# Patient Record
Sex: Male | Born: 1960 | Race: White | Hispanic: No | Marital: Married | Smoking: Former smoker
Health system: Southern US, Community
[De-identification: ages and names within clinical notes are randomized; demographics above are authoritative.]

## PROBLEM LIST (undated history)

## (undated) DIAGNOSIS — F419 Anxiety disorder, unspecified: Secondary | ICD-10-CM

## (undated) DIAGNOSIS — K5792 Diverticulitis of intestine, part unspecified, without perforation or abscess without bleeding: Secondary | ICD-10-CM

## (undated) DIAGNOSIS — I10 Essential (primary) hypertension: Secondary | ICD-10-CM

## (undated) HISTORY — PX: COLON RESECTION: SHX5231

## (undated) HISTORY — PX: SKIN BIOPSY: SHX1

---

## 2009-04-03 ENCOUNTER — Emergency Department (HOSPITAL_COMMUNITY): Admission: EM | Admit: 2009-04-03 | Discharge: 2009-04-03 | Payer: Self-pay | Admitting: Emergency Medicine

## 2010-08-29 LAB — TROPONIN I: Troponin I: 0.01 ng/mL (ref 0.00–0.06)

## 2010-08-29 LAB — BASIC METABOLIC PANEL
GFR calc Af Amer: >60 mL/min (ref >60)
GFR calc non Af Amer: >60 mL/min (ref >60)
Potassium: 3.9 mEq/L (ref 3.5–5.1)
Sodium: 139 mEq/L (ref 135–145)

## 2011-09-18 ENCOUNTER — Encounter (HOSPITAL_BASED_OUTPATIENT_CLINIC_OR_DEPARTMENT_OTHER): Payer: Self-pay | Admitting: Emergency Medicine

## 2011-09-18 ENCOUNTER — Emergency Department (INDEPENDENT_AMBULATORY_CARE_PROVIDER_SITE_OTHER): Payer: 59

## 2011-09-18 ENCOUNTER — Observation Stay (HOSPITAL_BASED_OUTPATIENT_CLINIC_OR_DEPARTMENT_OTHER)
Admission: EM | Admit: 2011-09-18 | Discharge: 2011-09-19 | Disposition: A | Discharge status: Home / Self Care | Payer: 59 | Attending: Emergency Medicine | Admitting: Emergency Medicine

## 2011-09-18 DIAGNOSIS — I10 Essential (primary) hypertension: Secondary | ICD-10-CM | POA: Insufficient documentation

## 2011-09-18 DIAGNOSIS — R079 Chest pain, unspecified: Principal | ICD-10-CM | POA: Insufficient documentation

## 2011-09-18 DIAGNOSIS — F411 Generalized anxiety disorder: Secondary | ICD-10-CM | POA: Insufficient documentation

## 2011-09-18 HISTORY — DX: Anxiety disorder, unspecified: F41.9

## 2011-09-18 HISTORY — DX: Diverticulitis of intestine, part unspecified, without perforation or abscess without bleeding: K57.92

## 2011-09-18 HISTORY — DX: Essential (primary) hypertension: I10

## 2011-09-18 LAB — DIFFERENTIAL
Basophils Relative: 0 % (ref 0–1)
Lymphs Abs: 3 10*3/uL (ref 0.7–4.0)
Monocytes Relative: 7 % (ref 3–12)
Neutro Abs: 4.4 10*3/uL (ref 1.7–7.7)
Neutrophils Relative %: 51 % (ref 43–77)

## 2011-09-18 LAB — COMPREHENSIVE METABOLIC PANEL
ALT: 20 U/L (ref 0–53)
Albumin: 4.6 g/dL (ref 3.5–5.2)
Alkaline Phosphatase: 72 U/L (ref 39–117)
BUN: 19 mg/dL (ref 6–23)
Chloride: 101 mEq/L (ref 96–112)
Glucose, Bld: 102 mg/dL — ABNORMAL HIGH (ref 70–99)
Potassium: 4 mEq/L (ref 3.5–5.1)
Sodium: 140 mEq/L (ref 135–145)
Total Bilirubin: 0.5 mg/dL (ref 0.3–1.2)

## 2011-09-18 LAB — CARDIAC PANEL(CRET KIN+CKTOT+MB+TROPI)
CK, MB: 1.7 ng/mL (ref 0.3–4.0)
Relative Index: INVALID (ref 0.0–2.5)
Total CK: 88 U/L (ref 7–232)

## 2011-09-18 LAB — CBC
Hemoglobin: 16.1 g/dL (ref 13.0–17.0)
RBC: 5.02 MIL/uL (ref 4.22–5.81)

## 2011-09-18 LAB — TROPONIN I: Troponin I: <0.3 ng/mL (ref <0.30)

## 2011-09-18 MED ORDER — ASPIRIN 81 MG PO CHEW
324.0000 mg | CHEWABLE_TABLET | Freq: Once | ORAL | Status: AC
  Administered 2011-09-18: 324 mg via ORAL

## 2011-09-18 MED ORDER — ASPIRIN 81 MG PO CHEW: 324.0000 mg | CHEWABLE_TABLET | Freq: Once | ORAL | Status: DC

## 2011-09-18 MED ORDER — ASPIRIN 81 MG PO CHEW
CHEWABLE_TABLET | ORAL | Status: AC
  Filled 2011-09-18: qty 4

## 2011-09-18 MED ORDER — SODIUM CHLORIDE 0.9 % IV SOLN
1000.0000 mL | INTRAVENOUS | Status: DC
  Administered 2011-09-18 – 2011-09-19 (×3): 1000 mL via INTRAVENOUS

## 2011-09-18 NOTE — ED Provider Notes (Deleted)
7:31 PM  Date: 09/18/2011  Rate:78  Rhythm: normal sinus rhythm  QRS Axis: normal  Intervals: normal  ST/T Wave abnormalities: normal  Conduction Disutrbances:none  Narrative Interpretation: Normal EKG  Old EKG Reviewed: none available    Carleene Cooper III, MD 09/18/11 1932

## 2011-09-18 NOTE — ED Provider Notes (Signed)
History     CSN: [id number]  Arrival date & time 09/18/11  1911   First MD Initiated Contact with Patient 09/18/11 2028      Chief Complaint  Patient presents with  . Chest Pain    (Consider location/radiation/quality/duration/timing/severity/associated sxs/prior treatment) HPI  Patient history is for her to [facility] from Dr. Ignacia Palma to accepting ER provider, Dr. Judd Lien for chest pain protocol.  Pt is a 51 year old man who developed chest pain in the precordial region abut 3:30 P.M. Today. The pain radiated to his left arm. He finished work and went home and measured his blood pressure and that was elevataed at 144/94. He continued with pain, rated at a 4, and therefore called his primary care physician, Fuller Mandril, M.D., whose office advised ED evaluation. He had a prior chest pain workup 4 years ago in [place], [place], which showed a negative stress test and he was placed on Prilosec then. There is no history of known heart disease. He is on antihypertensive medication.  Chest pain occurs constantly. The chest pain is improving. At its most intense, the pain is at 4/10. The pain is currently at 1/10. The severity of the pain is moderate. The quality of the pain is described as pressure-like. The pain radiates to the left arm. Exacerbated by: Nothing. He tried nothing for the symptoms.   At time of transfer chest pain was rated at 0-1.  Negative chest x-ray, troponin, and non-provocative EKG prior to transfer. Patient voices his knowledge of the chest pain protocol and is agreeable to treatment plan. He is scheduled for CT coronary for the morning.   Past Medical History  Diagnosis Date  . Hypertension   . Diverticulitis   . Anxiety     Past Surgical History  Procedure Date  . Colon resection   . Skin biopsy     No family history on file.  History  Substance Use Topics  . Smoking status: Former Games developer  . Smokeless tobacco: Not on file    . Alcohol Use: Yes      Review of Systems  All other systems reviewed and are negative.    Allergies  Codeine  Home Medications   Current Outpatient Rx  Name Route Sig Dispense Refill  . BENAZEPRIL HCL 40 MG PO TABS Oral Take 20 mg by mouth daily. Patient cuts this medication in half, and takes 20 milligrams of this medication.    Marland Kitchen FISH OIL PO Oral Take 1 capsule by mouth daily.    Marland Kitchen PRILOSEC PO Oral Take 1 capsule by mouth daily. Patient uses this medication for heartburn.    Marland Kitchen PROBIOTIC FORMULA PO Oral Take 1 tablet by mouth daily. Patient uses this medication for digestive health.      BP 130/87  Pulse 72  Temp(Src) 98 F (36.7 C) (Oral)  Resp 18  Ht 5\' 10"  (1.778 m)  Wt 205 lb (92.987 kg)  BMI 29.41 kg/m2  SpO2 98%  Physical Exam  Nursing note and vitals reviewed. Constitutional: He is oriented to person, place, and time. He appears well-developed and well-nourished. No distress.  HENT:  Head: Normocephalic and atraumatic.  Eyes: Conjunctivae are normal.  Neck: Normal range of motion. Neck supple.  Cardiovascular: Normal rate, regular rhythm, normal heart sounds and intact distal pulses.  Exam reveals no gallop and no friction rub.   No murmur heard. Pulmonary/Chest: Effort normal and breath sounds normal. No respiratory distress. He has  no wheezes. He has no rales. He exhibits no tenderness.  Abdominal: Soft. Bowel sounds are normal. He exhibits no distension and no mass. There is no tenderness. There is no rebound and no guarding.  Musculoskeletal: Normal range of motion. He exhibits no edema and no tenderness.  Neurological: He is alert and oriented to person, place, and time.  Skin: Skin is warm and dry. No rash noted. He is not diaphoretic. No erythema.  Psychiatric: He has a normal mood and affect.    ED Course  Procedures (including critical care time)  Chest pain protocol initiated  Labs Reviewed  DIFFERENTIAL - Abnormal; Notable for the  following:    Eosinophils Relative 7 (*)    All other components within normal limits  COMPREHENSIVE METABOLIC PANEL - Abnormal; Notable for the following:    Glucose, Bld 102 (*)    All other components within normal limits  CBC  TROPONIN I  CK TOTAL AND CKMB  CARDIAC PANEL(CRET KIN+CKTOT+MB+TROPI)  CARDIAC PANEL(CRET KIN+CKTOT+MB+TROPI)  CARDIAC PANEL(CRET KIN+CKTOT+MB+TROPI)   Dg Chest 2 View  09/18/2011  *RADIOLOGY REPORT*  Clinical Data: Chest pain.  CHEST - 2 VIEW  Comparison: None.  Findings: Two views of the chest demonstrate clear lungs. Heart and mediastinum are within normal limits.  The trachea is midline. The T8 and T9 vertebral body heights are slightly decreased.  These findings are probably degenerative in etiology and difficult to exclude a mild compression deformity, particularly at T8.  IMPRESSION: No acute chest findings.  Mild vertebral body height in the lower thoracic spine as described.  Suspect this is degenerative in etiology but recommend clinical correlation.  Original Report Authenticated By: Richarda Overlie, M.D.     1. Chest pain       MDM   Patient has been chest pain-free since arrival to the CDU. Sign out given to Dr. Dierdre Highman. Patient expresses his high degree of claustrophobia and stress even with prior CT scans and therefore we'll switch his study to a cardiac stress test and echo in the morning because of potential complication to decrease heart rate below 60 which is needed for coronary CT. Patient is agreeable plan.       Jenness Corner, [place] 09/18/11 2355

## 2011-09-18 NOTE — ED Notes (Signed)
Pt reports tightness to left side of chest with pain in left arm and nausea. Onset of symptoms 1530 today while at work sitting at desk.

## 2011-09-18 NOTE — ED Notes (Signed)
MD at bedside. 

## 2011-09-18 NOTE — ED Provider Notes (Cosign Needed)
History     CSN: [id number]  Arrival date & time 09/18/11  1911   First MD Initiated Contact with Patient 09/18/11 2028      Chief Complaint  Patient presents with  . Chest Pain    (Consider location/radiation/quality/duration/timing/severity/associated sxs/prior treatment) HPI Comments: Pt is a 51 year old man who developed chest pain in the precordial region abut 3:30 P.M. Today.  The pain radiated to his left arm.  He finished work and went home and measured his blood pressure and that was elevataed at 144/94.  He continued with pain, rated at a 4, and therefore called his primary care physician, Fuller Mandril, M.D., whose office advised ED evaluation.  He had a prior chest pain workup 4 years ago in [place], [place], which showed a negative stress test and he was placed on Prilosec then.  There is no history of known heart disease.  He is on antihypertensive medication.  Patient is a 51 y.o. male presenting with chest pain.  Chest Pain The chest pain began 6 - 12 hours ago. Chest pain occurs constantly. The chest pain is improving. At its most intense, the pain is at 4/10. The pain is currently at 1/10. The severity of the pain is moderate. The quality of the pain is described as pressure-like. The pain radiates to the left arm. Exacerbated by: Nothing. He tried nothing for the symptoms.     Past Medical History  Diagnosis Date  . Hypertension   . Diverticulitis   . Anxiety     Past Surgical History  Procedure Date  . Colon resection   . Skin biopsy     No family history on file.  History  Substance Use Topics  . Smoking status: Former Games developer  . Smokeless tobacco: Not on file  . Alcohol Use: Yes      Review of Systems  Constitutional: Negative.   HENT: Negative.   Eyes: Negative.   Respiratory: Negative.   Cardiovascular: Positive for chest pain.  Gastrointestinal: Negative.   Genitourinary: Negative.   Musculoskeletal: Negative.   Skin: Negative.     Neurological: Negative.   Psychiatric/Behavioral: Negative.     Allergies  Codeine  Home Medications   Current Outpatient Rx  Name Route Sig Dispense Refill  . BENAZEPRIL HCL 40 MG PO TABS Oral Take 20 mg by mouth daily. Patient cuts this medication in half, and takes 20 milligrams of this medication.    Marland Kitchen FISH OIL PO Oral Take 1 capsule by mouth daily.    Marland Kitchen PRILOSEC PO Oral Take 1 capsule by mouth daily. Patient uses this medication for heartburn.    Marland Kitchen PROBIOTIC FORMULA PO Oral Take 1 tablet by mouth daily. Patient uses this medication for digestive health.      BP 160/99  Pulse 72  Temp(Src) 98 F (36.7 C) (Oral)  Resp 18  Ht 5\' 10"  (1.778 m)  Wt 205 lb (92.987 kg)  BMI 29.41 kg/m2  SpO2 99%  Physical Exam  Nursing note and vitals reviewed. Constitutional: He is oriented to person, place, and time. He appears well-developed and well-nourished.  HENT:  Head: Normocephalic and atraumatic.  Right Ear: External ear normal.  Left Ear: External ear normal.  Mouth/Throat: Oropharynx is clear and moist.  Eyes: Conjunctivae and EOM are normal. Pupils are equal, round, and reactive to light. Right eye exhibits discharge.  Neck: Normal range of motion. Neck supple.  Cardiovascular: Normal rate, regular rhythm and normal heart sounds.   Pulmonary/Chest: Effort normal  and breath sounds normal.  Abdominal: Soft. Bowel sounds are normal.  Musculoskeletal: Normal range of motion.  Neurological: He is alert and oriented to person, place, and time.       No sensory or motor deficit   Skin: Skin is warm and dry.  Psychiatric: He has a normal mood and affect. His behavior is normal.    ED Course  Procedures (including critical care time)  Labs Reviewed  DIFFERENTIAL - Abnormal; Notable for the following:    Eosinophils Relative 7 (*)    All other components within normal limits  COMPREHENSIVE METABOLIC PANEL - Abnormal; Notable for the following:    Glucose, Bld 102 (*)     All other components within normal limits  CBC  TROPONIN I  CK TOTAL AND CKMB   Dg Chest 2 View  09/18/2011  *RADIOLOGY REPORT*  Clinical Data: Chest pain.  CHEST - 2 VIEW  Comparison: None.  Findings: Two views of the chest demonstrate clear lungs. Heart and mediastinum are within normal limits.  The trachea is midline. The T8 and T9 vertebral body heights are slightly decreased.  These findings are probably degenerative in etiology and difficult to exclude a mild compression deformity, particularly at T8.  IMPRESSION: No acute chest findings.  Mild vertebral body height in the lower thoracic spine as described.  Suspect this is degenerative in etiology but recommend clinical correlation.  Original Report Authenticated By: Richarda Overlie, M.D.   7:31 PM  Date: 09/18/2011  Rate:78  Rhythm: normal sinus rhythm  QRS Axis: normal  Intervals: normal  ST/T Wave abnormalities: normal  Conduction Disutrbances:none  Narrative Interpretation: Normal EKG  Old EKG Reviewed: none available   8:53 PM Results for orders placed during the hospital encounter of 09/18/11  CBC      Component Value Range   WBC 8.7  4.0 - 10.5 (K/uL)   RBC 5.02  4.22 - 5.81 (MIL/uL)   Hemoglobin 16.1  13.0 - 17.0 (g/dL)   HCT 16.1  09.6 - 04.5 (%)   MCV 90.8  78.0 - 100.0 (fL)   MCH 32.1  26.0 - 34.0 (pg)   MCHC 35.3  30.0 - 36.0 (g/dL)   RDW 40.9  81.1 - 91.4 (%)   Platelets 190  150 - 400 (K/uL)  DIFFERENTIAL      Component Value Range   Neutrophils Relative 51  43 - 77 (%)   Neutro Abs 4.4  1.7 - 7.7 (K/uL)   Lymphocytes Relative 35  12 - 46 (%)   Lymphs Abs 3.0  0.7 - 4.0 (K/uL)   Monocytes Relative 7  3 - 12 (%)   Monocytes Absolute 0.6  0.1 - 1.0 (K/uL)   Eosinophils Relative 7 (*) 0 - 5 (%)   Eosinophils Absolute 0.6  0.0 - 0.7 (K/uL)   Basophils Relative 0  0 - 1 (%)   Basophils Absolute 0.0  0.0 - 0.1 (K/uL)  COMPREHENSIVE METABOLIC PANEL      Component Value Range   Sodium 140  135 - 145 (mEq/L)    Potassium 4.0  3.5 - 5.1 (mEq/L)   Chloride 101  96 - 112 (mEq/L)   CO2 28  19 - 32 (mEq/L)   Glucose, Bld 102 (*) 70 - 99 (mg/dL)   BUN 19  6 - 23 (mg/dL)   Creatinine, Ser 7.82  0.50 - 1.35 (mg/dL)   Calcium 9.7  8.4 - 95.6 (mg/dL)   Total Protein 7.7  6.0 - 8.3 (g/dL)  Albumin 4.6  3.5 - 5.2 (g/dL)   AST 21  0 - 37 (U/L)   ALT 20  0 - 53 (U/L)   Alkaline Phosphatase 72  39 - 117 (U/L)   Total Bilirubin 0.5  0.3 - 1.2 (mg/dL)   GFR calc non Af Amer >90  >90 (mL/min)   GFR calc Af Amer >90  >90 (mL/min)  TROPONIN I      Component Value Range   Troponin I <0.30  <0.30 (ng/mL)  CK TOTAL AND CKMB      Component Value Range   Total CK 96  7 - 232 (U/L)   CK, MB 1.7  0.3 - 4.0 (ng/mL)   Relative Index RELATIVE INDEX IS INVALID  0.0 - 2.5    Dg Chest 2 View  09/18/2011  *RADIOLOGY REPORT*  Clinical Data: Chest pain.  CHEST - 2 VIEW  Comparison: None.  Findings: Two views of the chest demonstrate clear lungs. Heart and mediastinum are within normal limits.  The trachea is midline. The T8 and T9 vertebral body heights are slightly decreased.  These findings are probably degenerative in etiology and difficult to exclude a mild compression deformity, particularly at T8.  IMPRESSION: No acute chest findings.  Mild vertebral body height in the lower thoracic spine as described.  Suspect this is degenerative in etiology but recommend clinical correlation.  Original Report Authenticated By: Richarda Overlie, M.D.    Pt seen and physical exam performed.  Lab workup with EKG, chest x-ray and cardiac enzymes was negative.  Call to Geoffery Lyons, M.D. At [facility] ED, who accepts pt in transfer to the CDU Observation unit for chest pain workup.    1. Chest pain          Carleene Cooper III, MD 09/18/11 2106

## 2011-09-19 ENCOUNTER — Encounter (HOSPITAL_COMMUNITY): Payer: Self-pay | Admitting: *Deleted

## 2011-09-19 ENCOUNTER — Other Ambulatory Visit (HOSPITAL_COMMUNITY): Payer: 59

## 2011-09-19 DIAGNOSIS — R072 Precordial pain: Secondary | ICD-10-CM

## 2011-09-19 LAB — CARDIAC PANEL(CRET KIN+CKTOT+MB+TROPI)
Relative Index: INVALID (ref 0.0–2.5)
Total CK: 73 U/L (ref 7–232)

## 2011-09-19 MED ORDER — PERFLUTREN LIPID MICROSPHERE 6.52 MG/ML IV SUSP
10.0000 uL/kg | Freq: Once | INTRAVENOUS | Status: AC
  Administered 2011-09-19: 10:00:00 via INTRAVENOUS

## 2011-09-19 NOTE — ED Notes (Signed)
Patient remains on monitor and sats of 99% with NAD at this time.

## 2011-09-19 NOTE — Discharge Instructions (Signed)
Read instructions below for reasons to return to the Emergency Department. It is recommended that your follow up with your Primary Care Doctor in regards to today's visit. If you do not have a doctor, use the resource guide listed below to help you find one. Begin taking over the counter Prilosec or Zegrid as directed.   Chest Pain (Nonspecific)  HOME CARE INSTRUCTIONS  For the next few days, avoid physical activities that bring on chest pain. Continue physical activities as directed.  Do not smoke cigarettes or drink alcohol until your symptoms are gone.  Only take over-the-counter or prescription medicine for pain, discomfort, or fever as directed by your caregiver.  Follow your caregiver's suggestions for further testing if your chest pain does not go away.  Keep any follow-up appointments you made. If you do not go to an appointment, you could develop lasting (chronic) problems with pain. If there is any problem keeping an appointment, you must call to reschedule.  SEEK MEDICAL CARE IF:  You think you are having problems from the medicine you are taking. Read your medicine instructions carefully.  Your chest pain does not go away, even after treatment.  You develop a rash with blisters on your chest.  SEEK IMMEDIATE MEDICAL CARE IF:  You have increased chest pain or pain that spreads to your arm, neck, jaw, back, or belly (abdomen).  You develop shortness of breath, an increasing cough, or you are coughing up blood.  You have severe back or abdominal pain, feel sick to your stomach (nauseous) or throw up (vomit).  You develop severe weakness, fainting, or chills.  You have an oral temperature above 102 F (38.9 C), not controlled by medicine.   THIS IS AN EMERGENCY. Do not wait to see if the pain will go away. Get medical help at once. Call your local emergency services (911 in [place] Do not drive yourself to the hospital.   RESOURCE GUIDE  Dental Problems  Patients with  Medicaid: [facility]                     [facility] [address]                                           [address] Phone:  [phone]                                                  Phone:  [phone]  If unable to pay or uninsured, contact:  [facility] or [facility] to become qualified for the adult dental clinic.  Chronic Pain Problems Contact [facility]  [phone] Patients need to be referred by their primary care doctor.  Insufficient Money for Medicine Contact [employer]:  call "211" or [facility] 271-5999.  No Primary Care Doctor Call [facility]  832-8000 Other agencies that provide inexpensive medical care    [facility]  [phone]     Internal Medicine  [phone]    [facility]  [phone]    Women's Clinic  [phone]    [facility]  [phone]    [facility]  [phone]  Psychological Services   [facility]  [phone] [facility]  [phone] [facility]   800 853-5163 (emergency services 641-4993)  Substance Abuse Resources [facility]  336-882-2125 [facility] 336-784-9470 [facility] 336-285-9073 [facility] 336-845-3988 Residential & Outpatient Substance Abuse Program  800-659-3381  Abuse/Neglect [place] Child Abuse Hotline (336) 641-3795 [place] Child Abuse Hotline 800-378-5315 (After Hours)  Emergency Shelter [facility] (336) 271-5985  [facility] (336) 275-9566 [facility] (704) 372-4663  MRSA Hotline #:   832-7006    [place] Resources  [facility] [place]     [employer]                          [facility] [address] [place]                       [address]      [address]  [place]                                                 [place]                            [place] Phone:  [phone]                                   Phone:  [phone]                 Phone:  [phone]  [facility] Phone:  [phone]  [place] Child Abuse Hotline (336) 342-1394 (336) 342-3537 (After Hours)   

## 2011-09-19 NOTE — ED Provider Notes (Signed)
7:38 AM Patient moved to CDU under chest pain protocol. Patient resting comfortably at present without return of chest pain over night. Lungs CTA bilaterally. S1/S2, RRR, no murmur. Abdomen soft, bowel sounds present. Strong distal pulses palpated all extremities. Sinus rhythm on monitor without ectopy. Troponin negative x 2. CK, CK-MB, 12 lead reviewed, no indication of ischemia. Patient scheduled for Stress echo in AM. Diagnostic and treatment plan discussed with patient.  9:00 AM Pt re-evaluated and is still CP free, VSS, and in NAD. No complaints at this time  10:15 AM Stress test pending still. Pt resting comfortably.  11:51 AM  Stress results: There are no ST or T wave changes to suggest ischemia. Maximal heart rate during stress was 169bpm. The target heart rate was achieved. The heart rate response to stress was normal. The rate-pressure product for the peak heart rate and blood pressure was Hg/min.  Patient is to be discharged with recommendation to follow up with PCP in regards to today's hospital visit. Chest pain is not likely of cardiac or pulmonary etiology d/t presentation, perc negative, VSS, no tracheal deviation, no JVD or new murmur, RRR, breath sounds equal bilaterally, EKG without acute abnormalities, negative troponin x 3, normal stress echo and negative CXR. Pt has been advised start a PPI and return to the ED is CP becomes exertional, associated with diaphoresis or nausea, radiates to left jaw/arm, worsens or becomes concerning in any way. Pt appears reliable for follow up and is agreeable to discharge.       Jaci Carrel, [place] 09/19/11 1153

## 2011-09-19 NOTE — ED Provider Notes (Signed)
Medical screening examination/treatment/procedure(s) were performed by non-physician practitioner and as supervising physician I was immediately available for consultation/collaboration.  Raeford Razor, MD 09/19/11 1655

## 2011-09-19 NOTE — ED Notes (Signed)
Diet tray ordered 

## 2011-09-19 NOTE — ED Notes (Signed)
Discharged home with written and verbal instruction.  Wife called to pick up patient.  Left ER; ambulatory steady gait.  No questions or concerns at discharge.

## 2011-09-19 NOTE — ED Notes (Signed)
Patient transported to Echo.

## 2011-09-19 NOTE — ED Notes (Signed)
First meeting with patient. Patient remains on monitor and sats of 98%. Patient denies any chest pain at this time.  

## 2011-09-19 NOTE — Progress Notes (Signed)
*  PRELIMINARY RESULTS* Echocardiogram Echocardiogram Stress Test with Definity has been performed.  Glean Salen [facility] 09/19/2011, 4:37 PM

## 2011-09-19 NOTE — OR Nursing (Signed)
1124 patient given 2cc IV definity for a stress test study.  Denies chest of back pain. On treadmill for a target heart rate of 150. 1055 3cc of definity IV given prior to leaving the treadmill 1057 2 cc of definity IV given  Denies chest pain or back pain

## 2011-09-22 NOTE — ED Provider Notes (Signed)
Medical screening examination/treatment/procedure(s) were performed by non-physician practitioner and as supervising physician I was immediately available for consultation/collaboration.  Josilynn Losh, MD 09/22/11 1721 

## 2011-09-22 NOTE — H&P (Signed)
Care assumed at shift change.  Patient in the CDU on the chest pain protocol.  Will undergo stress echo due to his claustrophobia with ct scans and mris.  Vitals are stable and patient is resting comfortable.

## 2011-09-23 MED FILL — Perflutren Lipid Microsphere IV Susp 6.52 MG/ML: INTRAVENOUS | Qty: 2 | Status: AC

## 2012-10-25 ENCOUNTER — Encounter (HOSPITAL_BASED_OUTPATIENT_CLINIC_OR_DEPARTMENT_OTHER): Payer: Self-pay | Admitting: *Deleted

## 2012-10-25 ENCOUNTER — Emergency Department (HOSPITAL_BASED_OUTPATIENT_CLINIC_OR_DEPARTMENT_OTHER)
Admission: EM | Admit: 2012-10-25 | Discharge: 2012-10-25 | Disposition: A | Discharge status: Home / Self Care | Payer: 59 | Attending: Emergency Medicine | Admitting: Emergency Medicine

## 2012-10-25 DIAGNOSIS — M549 Dorsalgia, unspecified: Secondary | ICD-10-CM

## 2012-10-25 DIAGNOSIS — Z8719 Personal history of other diseases of the digestive system: Secondary | ICD-10-CM | POA: Insufficient documentation

## 2012-10-25 DIAGNOSIS — M5380 Other specified dorsopathies, site unspecified: Secondary | ICD-10-CM | POA: Insufficient documentation

## 2012-10-25 DIAGNOSIS — G8929 Other chronic pain: Secondary | ICD-10-CM | POA: Insufficient documentation

## 2012-10-25 DIAGNOSIS — M545 Low back pain, unspecified: Secondary | ICD-10-CM | POA: Insufficient documentation

## 2012-10-25 DIAGNOSIS — I10 Essential (primary) hypertension: Secondary | ICD-10-CM | POA: Insufficient documentation

## 2012-10-25 DIAGNOSIS — R110 Nausea: Secondary | ICD-10-CM | POA: Insufficient documentation

## 2012-10-25 DIAGNOSIS — Z79899 Other long term (current) drug therapy: Secondary | ICD-10-CM | POA: Insufficient documentation

## 2012-10-25 DIAGNOSIS — Z87891 Personal history of nicotine dependence: Secondary | ICD-10-CM | POA: Insufficient documentation

## 2012-10-25 DIAGNOSIS — F411 Generalized anxiety disorder: Secondary | ICD-10-CM | POA: Insufficient documentation

## 2012-10-25 DIAGNOSIS — R209 Unspecified disturbances of skin sensation: Secondary | ICD-10-CM | POA: Insufficient documentation

## 2012-10-25 MED ORDER — SODIUM CHLORIDE 0.9 % IV BOLUS (SEPSIS)
500.0000 mL | Freq: Once | INTRAVENOUS | Status: AC
  Administered 2012-10-25: 1000 mL via INTRAVENOUS

## 2012-10-25 MED ORDER — HYDROMORPHONE HCL 2 MG/ML IJ SOLN: 1.0000 mg | Freq: Once | INTRAMUSCULAR | Status: DC

## 2012-10-25 MED ORDER — HYDROMORPHONE HCL PF 1 MG/ML IJ SOLN
INTRAMUSCULAR | Status: AC
  Administered 2012-10-25: 1 mg
  Filled 2012-10-25: qty 1

## 2012-10-25 MED ORDER — HYDROMORPHONE HCL PF 1 MG/ML IJ SOLN
INTRAMUSCULAR | Status: AC
  Administered 2012-10-25: 1 mg via INTRAVENOUS
  Filled 2012-10-25: qty 1

## 2012-10-25 MED ORDER — OXYCODONE-ACETAMINOPHEN 5-325 MG PO TABS: 1.0000 | ORAL_TABLET | Freq: Four times a day (QID) | ORAL | Status: DC | PRN

## 2012-10-25 MED ORDER — HYDROMORPHONE HCL 2 MG/ML IJ SOLN: 1.0000 mg | Freq: Once | INTRAMUSCULAR | Status: AC

## 2012-10-25 MED ORDER — SODIUM CHLORIDE 0.9 % IV SOLN: INTRAVENOUS | Status: DC

## 2012-10-25 MED ORDER — CYCLOBENZAPRINE HCL 10 MG PO TABS: 10.0000 mg | ORAL_TABLET | Freq: Two times a day (BID) | ORAL | Status: AC | PRN

## 2012-10-25 MED ORDER — ONDANSETRON HCL 4 MG/2ML IJ SOLN
4.0000 mg | Freq: Once | INTRAMUSCULAR | Status: AC
  Administered 2012-10-25: 4 mg via INTRAVENOUS
  Filled 2012-10-25: qty 2

## 2012-10-25 NOTE — ED Provider Notes (Signed)
History    This chart was scribed for Shelda Jakes, MD by Quintella Reichert, ED scribe.  This patient was seen in room MH04/MH04 and the patient's care was started at 4:07 PM.   CSN: [id number]  Arrival date & time 10/25/12  1546       Chief Complaint  Patient presents with  . Back Pain     Patient is a 52 y.o. male presenting with back pain. The history is provided by the patient. No language interpreter was used.  Back Pain Location:  Lumbar spine Radiates to: left side, left lower leg. Pain severity:  Moderate Onset quality:  Gradual Duration: Over 1 year. Timing:  Intermittent Progression:  Worsening Chronicity:  Chronic Context: not falling and not recent injury   Relieved by:  Nothing Worsened by:  Nothing tried Ineffective treatments: oxycodone. Associated symptoms: numbness   Associated symptoms: no abdominal pain, no bladder incontinence, no chest pain, no dysuria, no headaches, no leg pain and no weakness     HPI Comments: Ronald Long is a 52 y.o. male who presents to the Emergency Department complaining of progressively-worsening, moderate-to-severe left lower back back pain that began over 1 year ago but has become more severe over the past 6-8 weeks.  Pt notes that he has been experiencing intermittent pain in the area for over 1 year accompanied by radiation down the lower left leg.  He notes that he went to [facility] 1 year ago and was given an MRI, which revealed a pinched sciatic nerve.  He was advised that surgery would likely not help.  He reports that 6-8 weeks ago pain became more severe and was accompanied by spasms in the area.  He denies recent falls or injuries that may have exacerbated pain.  He states pain is aggravated by turning and normally relieved by standing up, though he notes that today nothing relieves the pain.  He also notes that pain has been radiating to his left side today for the first time.  Pt states he attempted to  relieve symptoms today with oxycodone, without relief.  He also notes that on one occasion today his left leg and foot went numb on standing up, but presently he denies pain, numbness, or weakness in left calf or foot.  He also reports mild nausea associated with pain.  He denies fever, chills, cough, congestion, rhinorrhea, visual changes, CP, SOB, abdominal pain, emesis, diarrhea, dysuria, swelling in ankles, neck pain, rash, headache, or any other associated symptoms.  He denies h/o bleeding easily.  Pt medicates daily with benazepril-hcz, Zoloft and Prilosec.        Past Medical History  Diagnosis Date  . Hypertension   . Diverticulitis   . Anxiety     Past Surgical History  Procedure Laterality Date  . Colon resection    . Skin biopsy      No family history on file.  History  Substance Use Topics  . Smoking status: Former Games developer  . Smokeless tobacco: Not on file  . Alcohol Use: Yes      Review of Systems  HENT: Negative for congestion, rhinorrhea and neck pain.   Eyes: Negative for visual disturbance.  Respiratory: Negative for cough and shortness of breath.   Cardiovascular: Negative for chest pain and leg swelling.  Gastrointestinal: Positive for nausea. Negative for vomiting, abdominal pain and diarrhea.  Genitourinary: Negative for bladder incontinence and dysuria.  Musculoskeletal: Positive for back pain.  Skin: Negative for rash.  Neurological: Positive  for numbness. Negative for weakness and headaches.  Psychiatric/Behavioral: Negative for confusion.    Allergies  Codeine  Home Medications   Current Outpatient Rx  Name  Route  Sig  Dispense  Refill  . benazepril-hydrochlorthiazide (LOTENSIN HCT) 10-12.5 MG per tablet   Oral   Take 1 tablet by mouth daily.         . sertraline (ZOLOFT) 100 MG tablet   Oral   Take 100 mg by mouth daily.         . benazepril (LOTENSIN) 40 MG tablet   Oral   Take 20 mg by mouth daily. Patient cuts this medication  in half, and takes 20 milligrams of this medication.         . cyclobenzaprine (FLEXERIL) 10 MG tablet   Oral   Take 1 tablet (10 mg total) by mouth 2 (two) times daily as needed for muscle spasms.   20 tablet   0   . Omega-3 Fatty Acids (FISH OIL PO)   Oral   Take 1 capsule by mouth daily.         . Omeprazole (PRILOSEC PO)   Oral   Take 1 capsule by mouth daily. Patient uses this medication for heartburn.         Marland Kitchen oxyCODONE-acetaminophen (PERCOCET/ROXICET) 5-325 MG per tablet   Oral   Take 1-2 tablets by mouth every 6 (six) hours as needed for pain.   20 tablet   0   . Probiotic Product (PROBIOTIC FORMULA PO)   Oral   Take 1 tablet by mouth daily. Patient uses this medication for digestive health.           BP 121/90  Pulse 78  Temp(Src) 98 F (36.7 C)  Resp 20  SpO2 99%  Physical Exam  Nursing note and vitals reviewed. Constitutional: He is oriented to person, place, and time.  Eyes: Conjunctivae and EOM are normal. Pupils are equal, round, and reactive to light.  Cardiovascular: Normal rate and regular rhythm.   No murmur heard. Pulmonary/Chest: Effort normal and breath sounds normal. No respiratory distress. He has no wheezes. He has no rales.  Lungs clear bilaterally  Abdominal: Soft. Bowel sounds are normal. There is no tenderness.  Musculoskeletal: Normal range of motion.  Back non-tender. No muscle spasms in lumbar region.  Neurological: He is alert and oriented to person, place, and time. No cranial nerve deficit.    ED Course  Procedures (including critical care time)  DIAGNOSTIC STUDIES: Oxygen Saturation is 99% on room air, normal by my interpretation.    COORDINATION OF CARE: 4:11 PM-Discussed treatment plan which includes dilaudid in ED and home treatment with flexeril and oxycodone with pt at bedside and pt agreed to plan.      Labs Reviewed - No data to display No results found.   1. Back pain       MDM  Patient with  history of low back pain worse in the last few days. No neuro focal deficits consistent with sciatica at this point in time. No new injury to the back. Patient improved with pain medication in the emergency department. Patient has followup with orthopedics as needed. Patient we discharged home with her renewal of his Percocet and had Flexeril.       I personally performed the services described in this documentation, which was scribed in my presence. The recorded information has been reviewed and is accurate.     Shelda Jakes, MD 10/25/12 [phone]

## 2012-10-25 NOTE — ED Notes (Signed)
Patient has been experiencing back spasms for the past few weeks on the left and today it started hurting and patient unable to bend over. Took molixcam at 12pm and oxycodone 1:30pm, but no relief

## 2013-05-26 IMAGING — CR DG CHEST 2V
2 series · 2 of 2 positions shown · non-contrast
Comparison: None.

CLINICAL DATA: Chest pain.

CHEST - 2 VIEW

[w chest pa]
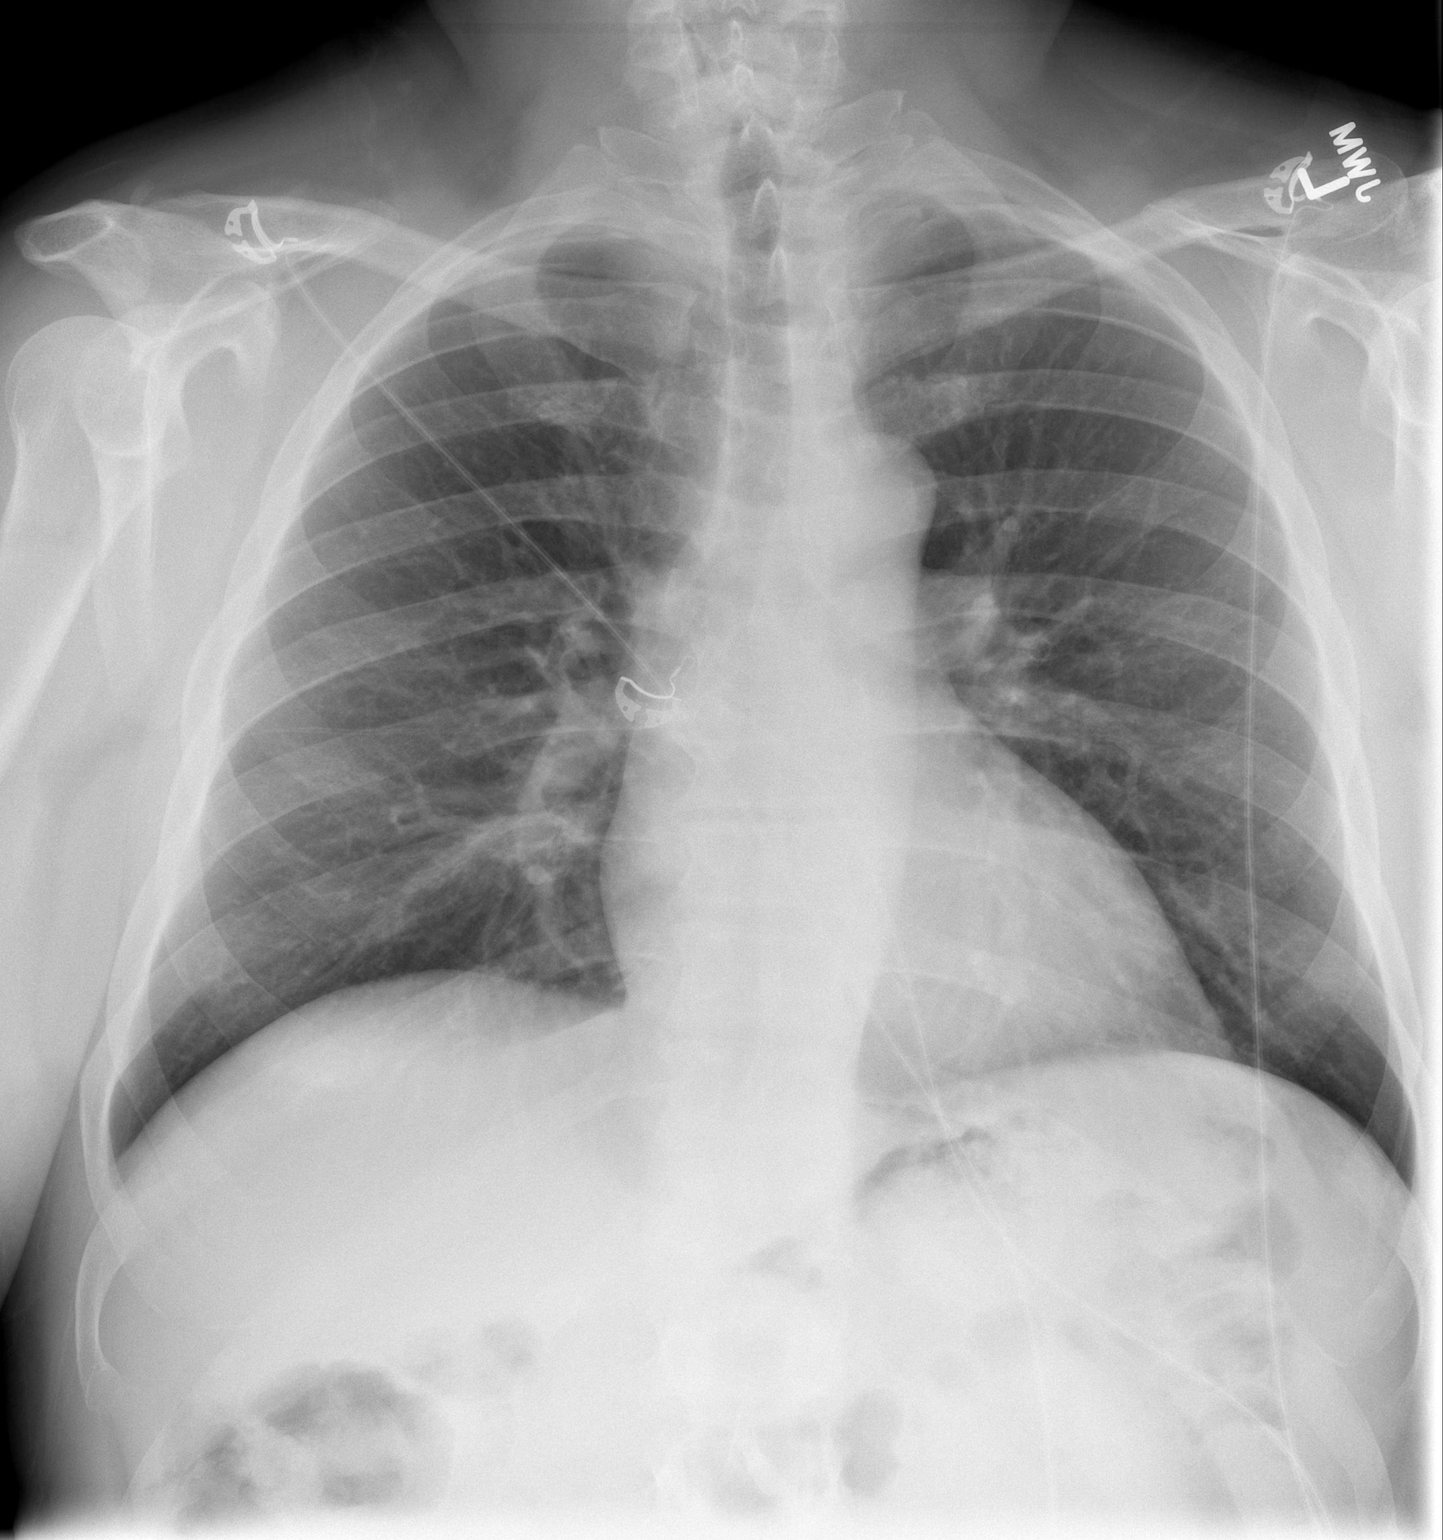

[w chest lat]
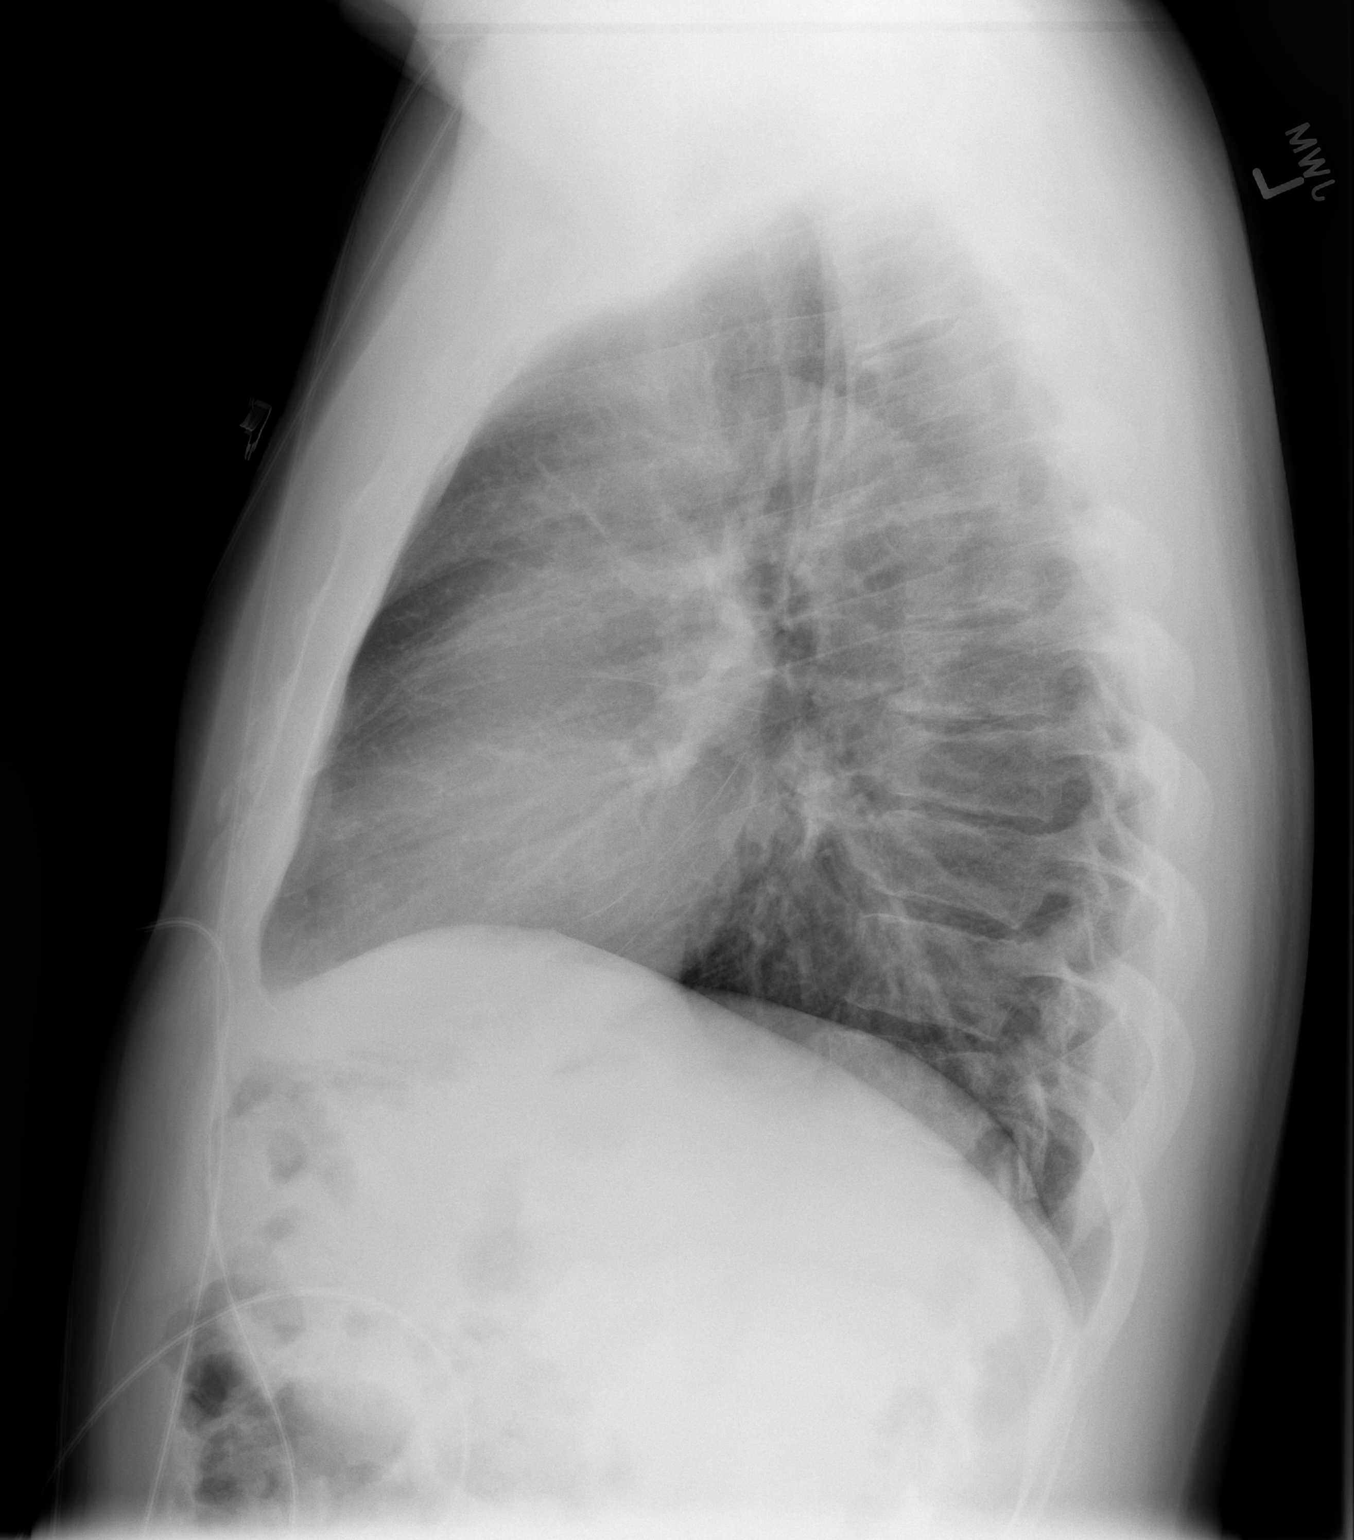

[2 of 2 positions shown; findings below may reference images not displayed]

FINDINGS: Two views of the chest demonstrate clear lungs. Heart and
mediastinum are within normal limits.  The trachea is midline. The
T8 and T9 vertebral body heights are slightly decreased.  These
findings are probably degenerative in etiology and difficult to
exclude a mild compression deformity, particularly at T8.
IMPRESSION: No acute chest findings.

Mild vertebral body height in the lower thoracic spine as
described.  Suspect this is degenerative in etiology but recommend
clinical correlation.

## 2016-08-08 ENCOUNTER — Encounter: Payer: Self-pay | Admitting: Pulmonary Disease

## 2016-08-08 ENCOUNTER — Ambulatory Visit (INDEPENDENT_AMBULATORY_CARE_PROVIDER_SITE_OTHER): Payer: 59 | Admitting: Pulmonary Disease

## 2016-08-08 VITALS — BP 130/76 | HR 90 | Ht 69.5 in | Wt 246.0 lb

## 2016-08-08 DIAGNOSIS — R059 Cough, unspecified: Secondary | ICD-10-CM

## 2016-08-08 DIAGNOSIS — R05 Cough: Secondary | ICD-10-CM

## 2016-08-08 DIAGNOSIS — G4719 Other hypersomnia: Secondary | ICD-10-CM | POA: Diagnosis not present

## 2016-08-08 LAB — NITRIC OXIDE: Nitric Oxide: 45

## 2016-08-08 MED ORDER — AZELASTINE-FLUTICASONE 137-50 MCG/ACT NA SUSP: 2.0000 | Freq: Every day | NASAL | 3 refills | Status: AC

## 2016-08-08 MED ORDER — OMEPRAZOLE 40 MG PO CPDR: 40.0000 mg | DELAYED_RELEASE_CAPSULE | Freq: Every day | ORAL | 5 refills | Status: DC

## 2016-08-08 NOTE — Progress Notes (Signed)
Ronald Long    [id number]    Jan 11, 1961  Primary Care Physician:[facility]  Referring Physician: No referring provider defined for this encounter.  Chief complaint:  Consult for evaluation of chronic cough  HPI: Ronald Long is 56 year old with past medical history of hypertension, barrets esophagitis. He has complaints of chronic cough since September 2016. The symptoms are nonproductive cough, he denies any wheezing, chest pain, palpitations. He has dyspnea during his coughing spells. He has episodes of paroxysmal coughing with associated dizziness. He's had multiple interventions including allergy test, prednisone, Flonase, cough syrup with minimal improvement in symptoms. He's used albuterol with some relief.   He had been maintained on Benzapril and hydrochlorothiazide for his hypertension for the past 4 years and has not had any issues with this before. He denies any seasonal allergies, postnasal drip. He has a history of Barrett's esophagitis diagnosed with EGD in the past and is on Prilosec 20 mg daily. He has not reported any sensitivities to cats or dogs. He has 3 cats at home, minor mold issues. He 10 pack smoking history quit in 2006. 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 His job involves a lot of talking which exacerbates his cough. He has symptoms of snoring, witnessed apneas and daytime sleepiness. He has never been evaluated for sleep apnea.  Outpatient Encounter Prescriptions as of 08/08/2016  Medication Sig  . ALPRAZolam (XANAX) 0.5 MG tablet TK 1 T PO TID PRN FOR ANXIETY  . benazepril (LOTENSIN) 40 MG tablet Take 20 mg by mouth daily. Patient cuts this medication in half, and takes 20 milligrams of this medication.  . cyclobenzaprine (FLEXERIL) 10 MG tablet Take 1 tablet (10 mg total) by mouth 2 (two) times daily as needed for muscle spasms.  Marland Kitchen ibuprofen (ADVIL,MOTRIN) 200 MG tablet Take 200 mg by mouth every  6 (six) hours as needed.  . Omeprazole (PRILOSEC PO) Take 1 capsule by mouth daily. Patient uses this medication for heartburn.  Marland Kitchen oxyCODONE-acetaminophen (PERCOCET/ROXICET) 5-325 MG per tablet Take 1-2 tablets by mouth every 6 (six) hours as needed for pain.  Marland Kitchen sertraline (ZOLOFT) 100 MG tablet Take 100 mg by mouth daily.  . [DISCONTINUED] benazepril-hydrochlorthiazide (LOTENSIN HCT) 10-12.5 MG per tablet Take 1 tablet by mouth daily.  . [DISCONTINUED] Omega-3 Fatty Acids (FISH OIL PO) Take 1 capsule by mouth daily.  . [DISCONTINUED] Probiotic Product (PROBIOTIC FORMULA PO) Take 1 tablet by mouth daily. Patient uses this medication for digestive health.   No facility-administered encounter medications on file as of 08/08/2016.     Allergies as of 08/08/2016 - Review Complete 08/08/2016  Allergen Reaction Noted  . Codeine Nausea And Vomiting 09/18/2011    Past Medical History:  Diagnosis Date  . Anxiety   . Diverticulitis   . Hypertension     Past Surgical History:  Procedure Laterality Date  . COLON RESECTION    . SKIN BIOPSY      Family History  Problem Relation Age of Onset  . Diabetes Mother   . Cancer Mother   . Allergic rhinitis Father   . Diabetes Sister   . Cancer Brother   . Heart disease Maternal Grandmother   . Cancer Maternal Grandmother   . Kidney disease Maternal Grandmother   . Heart disease Maternal Grandfather   . Lung cancer Maternal Grandfather   . Heart disease Paternal Grandmother   . Parkinson's disease Paternal Grandfather   .  Skin cancer Maternal Uncle     Social History   Social History  . Marital status: Married    Spouse name: N/A  . Number of children: N/A  . Years of education: N/A   Occupational History  . Not on file.   Social History Main Topics  . Smoking status: Former Games developermoker  . Smokeless tobacco: Never Used  . Alcohol use Yes  . Drug use: Yes     Comment: occ  . Sexual activity: Not on file   Other Topics Concern  .  Not on file   Social History Narrative  . No narrative on file    Review of systems: Review of Systems  Constitutional: Negative for fever and chills.  HENT: Negative.   Eyes: Negative for blurred vision.  Respiratory: as per HPI  Cardiovascular: Negative for chest pain and palpitations.  Gastrointestinal: Negative for vomiting, diarrhea, blood per rectum. Genitourinary: Negative for dysuria, urgency, frequency and hematuria.  Musculoskeletal: Negative for myalgias, back pain and joint pain.  Skin: Negative for itching and rash.  Neurological: Negative for dizziness, tremors, focal weakness, seizures and loss of consciousness.  Endo/Heme/Allergies: Negative for environmental allergies.  Psychiatric/Behavioral: Negative for depression, suicidal ideas and hallucinations.  All other systems reviewed and are negative.  Physical Exam: Blood pressure 130/76, pulse 90, height 5' 9.5" (1.765 m), weight 246 lb (111.6 kg), SpO2 96 %. Gen:      No acute distress HEENT:  EOMI, sclera anicteric Neck:     No masses; no thyromegaly Lungs:    Clear to auscultation bilaterally; normal respiratory effort CV:         Regular rate and rhythm; no murmurs Abd:      + bowel sounds; soft, non-tender; no palpable masses, no distension Ext:    No edema; adequate peripheral perfusion Skin:      Warm and dry; no rash Neuro: alert and oriented x 3 Psych: normal mood and affect  Data Reviewed: FENO 08/08/16- 45  CXR 06/17/16-no acute cardiopulmonary abnormality  Assessment:  Assessment for chronic cough. He likely has cough from upper airway cough syndrome from postnasal drip, GERD, ACE inhibitor use. Although he's been on the ACEI for 4 years is not uncommon for patients to develop symptoms later. FENO was mildly elevated which may indicate reactive airways with inflammation.   We'll start him on chlorphentermine antihistamine for his postnasal drip and increase his omeprazole to 40 mg twice daily. I  have advised him to stop taking the Benzapril and start alterative antihypertensive medication per his primary care doctor. Continue on the albuterol inhaler as needed. I educated him on behavioral changes to deal with cough including conscious suppression of the urge to cough, use of throat lozenges.  Suspected sleep apnea Scheduled for split-night sleep study.  Plan/Recommendations: - Start chlorpheniramine 8 mg tid, dymista nasal spray - Continue albuterol PRN - Increase omeprazole to 40 mg bid - Stop taking the benazepril. - Sleep study.  Chilton GreathousePraveen Yerania Chamorro MD  Pulmonary and Critical Care Pager [phone] 08/08/2016, 10:14 AM  CC: No ref. provider found

## 2016-08-08 NOTE — Patient Instructions (Signed)
We will start you on chlorpheniramine 8 mg 3 times daily and dymista nasal spray twice a day Start Omeprazole 40 mg twice a day Stop using the benzepril and start alternative antihypertensive medication prescribed by your primary care doctor We will schedule you for a split night sleep study  Return to clinic in 1 month.

## 2016-08-15 ENCOUNTER — Encounter: Payer: Self-pay | Admitting: Pulmonary Disease

## 2016-08-27 ENCOUNTER — Other Ambulatory Visit: Payer: Self-pay | Admitting: Pulmonary Disease

## 2016-08-27 MED ORDER — OMEPRAZOLE 40 MG PO CPDR: 40.0000 mg | DELAYED_RELEASE_CAPSULE | Freq: Two times a day (BID) | ORAL | 5 refills | Status: DC

## 2016-08-27 NOTE — Telephone Encounter (Signed)
Rx sent in

## 2016-08-27 NOTE — Telephone Encounter (Signed)
lmtcb X1 for pt. Need to verify pharmacy.  rx was changed by pm to omeprazole  bid at last office visit- this was not sent to pharmacy.

## 2016-08-27 NOTE — Telephone Encounter (Signed)
Pharmacy is [facility] on [address] in [place].  CB is [phone].

## 2016-09-11 ENCOUNTER — Other Ambulatory Visit: Payer: Self-pay

## 2016-09-11 DIAGNOSIS — G4719 Other hypersomnia: Secondary | ICD-10-CM

## 2016-09-13 ENCOUNTER — Encounter: Payer: Self-pay | Admitting: Pulmonary Disease

## 2016-09-13 ENCOUNTER — Ambulatory Visit (INDEPENDENT_AMBULATORY_CARE_PROVIDER_SITE_OTHER): Payer: 59 | Admitting: Pulmonary Disease

## 2016-09-13 VITALS — BP 120/74 | HR 90 | Ht 69.5 in | Wt 247.0 lb

## 2016-09-13 DIAGNOSIS — R0602 Shortness of breath: Secondary | ICD-10-CM | POA: Diagnosis not present

## 2016-09-13 MED ORDER — PREDNISONE 10 MG PO TABS: ORAL_TABLET | ORAL | 0 refills | Status: DC

## 2016-09-13 MED ORDER — BUDESONIDE-FORMOTEROL FUMARATE 80-4.5 MCG/ACT IN AERO: 2.0000 | INHALATION_SPRAY | Freq: Two times a day (BID) | RESPIRATORY_TRACT | 0 refills | Status: DC

## 2016-09-13 MED ORDER — BUDESONIDE-FORMOTEROL FUMARATE 80-4.5 MCG/ACT IN AERO: 2.0000 | INHALATION_SPRAY | Freq: Two times a day (BID) | RESPIRATORY_TRACT | 12 refills | Status: DC

## 2016-09-13 NOTE — Progress Notes (Signed)
Ronald Long    [id number]    Jul 05, 1960  Primary Care Physician:[facility]  Referring Physician: [facility] [address] [place], [place] [address]  Chief complaint:  Follow-up for chronic cough  HPI: Ronald Long is 56 year old with past medical history of hypertension, barrets esophagitis. He has complaints of chronic cough since September 2016. The symptoms are nonproductive cough, he denies any wheezing, chest pain, palpitations. He has dyspnea during his coughing spells. He has episodes of paroxysmal coughing with associated dizziness. He's had multiple interventions including allergy test, prednisone, Flonase, cough syrup with minimal improvement in symptoms. He's used albuterol with some relief.   He had been maintained on Benzapril and hydrochlorothiazide for his hypertension for the past 4 years and has not had any issues with this before. He denies any seasonal allergies, postnasal drip. He has a history of Barrett's esophagitis diagnosed with EGD in the past and is on Prilosec 20 mg daily. He has not reported any sensitivities to cats or dogs. He has 3 cats at home, minor mold issues. He 10 pack smoking history quit in 2006. 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 His job involves a lot of talking which exacerbates his cough. He has symptoms of snoring, witnessed apneas and daytime sleepiness. He has never been evaluated for sleep apnea.  Interim History: He has been taken of Benzapril and is currently on Norvasc and hydrochlorothiazide. He is also on chlorphentermine, dymista and PPI. He states that the cough has improved only minimally. He still has non productive cough, no dyspnea, wheezing.   Outpatient Encounter Prescriptions as of 09/13/2016  Medication Sig  . ALPRAZolam (XANAX) 0.5 MG tablet TK 1 T PO TID PRN FOR ANXIETY  . amLODipine (NORVASC) 5 MG tablet   .  Azelastine-Fluticasone (DYMISTA) 137-50 MCG/ACT SUSP Place 2 sprays into the nose daily.  . cyclobenzaprine (FLEXERIL) 10 MG tablet Take 1 tablet (10 mg total) by mouth 2 (two) times daily as needed for muscle spasms.  Marland Kitchen ibuprofen (ADVIL,MOTRIN) 200 MG tablet Take 200 mg by mouth every 6 (six) hours as needed.  Marland Kitchen omeprazole (PRILOSEC) 40 MG capsule Take 1 capsule (40 mg total) by mouth 2 (two) times daily.  Marland Kitchen oxyCODONE-acetaminophen (PERCOCET/ROXICET) 5-325 MG per tablet Take 1-2 tablets by mouth every 6 (six) hours as needed for pain.  Marland Kitchen sertraline (ZOLOFT) 100 MG tablet Take 100 mg by mouth daily.  . benazepril (LOTENSIN) 40 MG tablet Take 20 mg by mouth daily. Patient cuts this medication in half, and takes 20 milligrams of this medication.  . [DISCONTINUED] Omeprazole (PRILOSEC PO) Take 1 capsule by mouth daily. Patient uses this medication for heartburn.   No facility-administered encounter medications on file as of 09/13/2016.     Allergies as of 09/13/2016  . (No Active Allergies)    Past Medical History:  Diagnosis Date  . Anxiety   . Diverticulitis   . Hypertension     Past Surgical History:  Procedure Laterality Date  . COLON RESECTION    . SKIN BIOPSY      Family History  Problem Relation Age of Onset  . Diabetes Mother   . Cancer Mother   . Allergic rhinitis Father   . Diabetes Sister   . Cancer Brother   . Heart disease Maternal Grandmother   . Cancer Maternal Grandmother   . Kidney disease Maternal Grandmother   .  Heart disease Maternal Grandfather   . Lung cancer Maternal Grandfather   . Heart disease Paternal Grandmother   . Parkinson's disease Paternal Grandfather   . Skin cancer Maternal Uncle     Social History   Social History  . Marital status: Married    Spouse name: N/A  . Number of children: N/A  . Years of education: N/A   Occupational History  . Not on file.   Social History Main Topics  . Smoking status: Former Games developer  . Smokeless  tobacco: Never Used  . Alcohol use Yes  . Drug use: Yes     Comment: occ  . Sexual activity: Not on file   Other Topics Concern  . Not on file   Social History Narrative  . No narrative on file    Review of systems: Review of Systems  Constitutional: Negative for fever and chills.  HENT: Negative.   Eyes: Negative for blurred vision.  Respiratory: as per HPI  Cardiovascular: Negative for chest pain and palpitations.  Gastrointestinal: Negative for vomiting, diarrhea, blood per rectum. Genitourinary: Negative for dysuria, urgency, frequency and hematuria.  Musculoskeletal: Negative for myalgias, back pain and joint pain.  Skin: Negative for itching and rash.  Neurological: Negative for dizziness, tremors, focal weakness, seizures and loss of consciousness.  Endo/Heme/Allergies: Negative for environmental allergies.  Psychiatric/Behavioral: Negative for depression, suicidal ideas and hallucinations.  All other systems reviewed and are negative.  Physical Exam: Blood pressure 120/74, pulse 90, height 5' 9.5" (1.765 m), weight 247 lb (112 kg), SpO2 92 %. Gen:      No acute distress HEENT:  EOMI, sclera anicteric Neck:     No masses; no thyromegaly Lungs:    Clear to auscultation bilaterally; normal respiratory effort CV:         Regular rate and rhythm; no murmurs Abd:      + bowel sounds; soft, non-tender; no palpable masses, no distension Ext:    No edema; adequate peripheral perfusion Skin:      Warm and dry; no rash Neuro: alert and oriented x 3 Psych: normal mood and affect  Data Reviewed: FENO 08/08/16- 45  CXR 06/17/16-no acute cardiopulmonary abnormality. I had reviewed all images personally  Assessment:  Assessment for chronic cough. He likely has cough from upper airway cough syndrome from postnasal drip, GERD, ACE inhibitor use. Although he's been on the ACEI for 4 years is not uncommon for patients to develop symptoms later. FENO was mildly elevated which may  indicate reactive airways with inflammation.   He has been off ACEI for 1 month with no improvement in symptoms. We have adequately treated his postnasal drip and GERD at this point. I will next deal with  his reactive airways by giving him a short prednisone taper and start him on Symbicort. He'll be scheduled for pulmonary function tests. I reeducated him on behavioral changes to deal with cough including conscious suppression of the urge to cough, use of throat lozenges.  Suspected sleep apnea Scheduled for split-night sleep study next month. He is considering neck surgery for spinal stenosis. I have told him that from his lung standpoint is okay to proceed but he needs to finish the evaluation for sleep apnea before the surgery.  Plan/Recommendations: - Continue chlorpheniramine, dymista nasal spray, omeprazole - Continue albuterol PRN. Start symbicort - PFTs - Sleep study.   Chilton Greathouse MD [facility] Pager [phone] 09/13/2016, 4:47 PM  CC: Roe Coombs*

## 2016-09-13 NOTE — Patient Instructions (Addendum)
We will give a quick prednisone taper. Starting at 40 mg. Reduce dose by 10 mg every 2 days We'll give you Symbicort 80/4.5. Schedule PFTs Keep the sleep appointment to test sleep apnea  Return to clinic in 3 months.

## 2016-09-24 ENCOUNTER — Encounter (HOSPITAL_BASED_OUTPATIENT_CLINIC_OR_DEPARTMENT_OTHER): Payer: 59

## 2016-10-04 ENCOUNTER — Other Ambulatory Visit: Payer: Self-pay

## 2016-10-04 DIAGNOSIS — G4719 Other hypersomnia: Secondary | ICD-10-CM

## 2016-10-07 DIAGNOSIS — G4733 Obstructive sleep apnea (adult) (pediatric): Secondary | ICD-10-CM | POA: Diagnosis not present

## 2016-10-09 ENCOUNTER — Other Ambulatory Visit: Payer: Self-pay | Admitting: *Deleted

## 2016-10-09 DIAGNOSIS — G4719 Other hypersomnia: Secondary | ICD-10-CM

## 2016-10-09 DIAGNOSIS — G4733 Obstructive sleep apnea (adult) (pediatric): Secondary | ICD-10-CM | POA: Diagnosis not present

## 2016-10-16 ENCOUNTER — Telehealth: Payer: Self-pay | Admitting: Pulmonary Disease

## 2016-10-16 DIAGNOSIS — G4733 Obstructive sleep apnea (adult) (pediatric): Secondary | ICD-10-CM

## 2016-10-16 NOTE — Telephone Encounter (Signed)
Ronald Long, Praveen, MD  Blankenship, Margie A, CMA        Please let the patient know that the sleep study shows severe sleep apnea. Please order CPAP autoset 5-15 with mask fitting. Follow up in 2 months with download.    LMTCB

## 2016-10-17 NOTE — Telephone Encounter (Signed)
lmomtcb x 2  

## 2016-10-18 NOTE — Telephone Encounter (Signed)
Spoke with patient regarding CPAP. Patient agreed to use CPAP. Will place order for CPAP and mask fitting. Patient verbalized understanding and had no further questions. Offered to setup 2 month appt for patient but he stated he would wait until he had the machine to make a follow up appt.

## 2016-10-18 NOTE — Telephone Encounter (Signed)
lmomtcb x 3 for the pt.   

## 2016-10-18 NOTE — Telephone Encounter (Signed)
Patient returning call - he can be reached at [phone] -pr

## 2016-10-29 ENCOUNTER — Telehealth: Payer: Self-pay | Admitting: Pulmonary Disease

## 2016-10-29 MED ORDER — BENZONATATE 200 MG PO CAPS: 200.0000 mg | ORAL_CAPSULE | Freq: Two times a day (BID) | ORAL | 1 refills | Status: DC | PRN

## 2016-10-29 NOTE — Telephone Encounter (Signed)
I called but got the voice mail Call in tessalon perles 200 mg bid.  He can take mucinex and delsym OTC Make sure he is talking the symbicort as prescribed Ask if the prednisone helped any with the cough.  PM

## 2016-10-29 NOTE — Telephone Encounter (Signed)
Patient called back - pt will be in meetings from now until about 7pm tonight - it is okay to leave a detailed message on his voicemail. He will be available tomorrow morning between 8 am and 12pm - [phone] -pr

## 2016-10-29 NOTE — Telephone Encounter (Signed)
He has not taken any OTC meds for the cough. Still using Symbicort and omeprazole.   He wanted to mention that he still does not have his CPAP machine. He spoke with [facility] yesterday and they told him they have too many ahead of him and they would contact him shortly.   Patient wishes to use [facility] in [place]. PM, please advise.

## 2016-10-29 NOTE — Telephone Encounter (Signed)
Left detailed message on pt's VM at pt request- tessalon sent to pharmacy.  Will keep message open to follow up on status of prednisone helping cough.

## 2016-10-30 NOTE — Telephone Encounter (Signed)
lmomtcb x 2 for the pt to find out if the prednisone helped him.

## 2016-10-31 MED ORDER — HYDROCODONE-HOMATROPINE 5-1.5 MG/5ML PO SYRP: 5.0000 mL | ORAL_SOLUTION | Freq: Four times a day (QID) | ORAL | 0 refills | Status: AC | PRN

## 2016-10-31 MED ORDER — AZITHROMYCIN 250 MG PO TABS: ORAL_TABLET | ORAL | 0 refills | Status: AC

## 2016-10-31 NOTE — Telephone Encounter (Signed)
I called and spoke with the patient. He has increased sinus congestion, chills, nonproductive cough, sore throat. He denies any chest pain, palpitations 7 showed this is cardiac We will give him a Z-Pak, prescription for Hycodan cough syrup. I asked him to use this only if the tessalon does not help He'll continue on the Symbicort, Flonase, PPI. He has stopped taking the antihistamine  Hopefully he'll get started on the CPAP soon which will help with the cough. He has follow-up with me in 2 months time.  PM

## 2016-10-31 NOTE — Telephone Encounter (Signed)
lmtcb x1 for pt to verify pt's pharmacy.

## 2016-10-31 NOTE — Telephone Encounter (Signed)
Zpak has been sent to preferred pharmacy. Hycodan had been placed up front in brown folder for pickup. Pt is aware and voiced his understanding. Nothing further needed.

## 2016-10-31 NOTE — Telephone Encounter (Signed)
Called and spoke with pt and he stated that the prednisone did help some with the cough but the cough has never really gone away.  He stated that his entire family has the sinus issues going around and has a sore throat and cough.  He stated that at times with the cough--he coughs so hard at times he goes out for a few mins.  He is wondering if this is cardiovascular related or just pulmonary.  PM please advise thanks

## 2016-10-31 NOTE — Telephone Encounter (Signed)
Pt returned phone call and confirm [facility] pharmacy.Marland Kitchen.ert

## 2016-12-06 ENCOUNTER — Other Ambulatory Visit: Payer: Self-pay

## 2016-12-06 MED ORDER — BENZONATATE 200 MG PO CAPS: 200.0000 mg | ORAL_CAPSULE | Freq: Two times a day (BID) | ORAL | 1 refills | Status: AC | PRN

## 2016-12-26 ENCOUNTER — Encounter: Payer: 59 | Admitting: Pulmonary Disease

## 2016-12-26 ENCOUNTER — Ambulatory Visit: Payer: 59 | Admitting: Pulmonary Disease

## 2017-01-14 ENCOUNTER — Ambulatory Visit (INDEPENDENT_AMBULATORY_CARE_PROVIDER_SITE_OTHER): Payer: 59 | Admitting: Pulmonary Disease

## 2017-01-14 DIAGNOSIS — R0602 Shortness of breath: Secondary | ICD-10-CM

## 2017-01-14 LAB — PULMONARY FUNCTION TEST
DL/VA % pred: 103 %
DL/VA: 4.68 ml/min/mmHg/L
DLCO UNC: 29.19 ml/min/mmHg
DLCO cor % pred: 96 %
DLCO cor: 29.03 ml/min/mmHg
DLCO unc % pred: 97 %
FEF 25-75 Post: 4.35 L/sec
FEF 25-75 Pre: 3.27 L/sec
FEF2575-%Change-Post: 33 %
FEF2575-%PRED-PRE: 105 %
FEF2575-%Pred-Post: 140 %
FEV1-%CHANGE-POST: 7 %
FEV1-%PRED-PRE: 90 %
FEV1-%Pred-Post: 96 %
FEV1-POST: 3.47 L
FEV1-Pre: 3.23 L
FEV1FVC-%Change-Post: 3 %
FEV1FVC-%Pred-Pre: 105 %
FEV6-%Change-Post: 5 %
FEV6-%PRED-POST: 92 %
FEV6-%Pred-Pre: 88 %
FEV6-PRE: 3.96 L
FEV6-Post: 4.15 L
FEV6FVC-%CHANGE-POST: 0 %
FEV6FVC-%PRED-POST: 104 %
FEV6FVC-%PRED-PRE: 103 %
FVC-%CHANGE-POST: 4 %
FVC-%PRED-POST: 89 %
FVC-%Pred-Pre: 85 %
FVC-POST: 4.16 L
FVC-Pre: 4 L
POST FEV6/FVC RATIO: 100 %
PRE FEV6/FVC RATIO: 99 %
Post FEV1/FVC ratio: 83 %
Pre FEV1/FVC ratio: 81 %
RV % pred: 95 %
RV: 1.93 L
TLC % PRED: 92 %
TLC: 6.13 L

## 2017-01-14 NOTE — Progress Notes (Signed)
PFT done today. 

## 2017-01-21 ENCOUNTER — Encounter: Payer: Self-pay | Admitting: Adult Health

## 2017-01-21 ENCOUNTER — Ambulatory Visit (INDEPENDENT_AMBULATORY_CARE_PROVIDER_SITE_OTHER): Payer: 59 | Admitting: Adult Health

## 2017-01-21 DIAGNOSIS — G4733 Obstructive sleep apnea (adult) (pediatric): Secondary | ICD-10-CM | POA: Insufficient documentation

## 2017-01-21 DIAGNOSIS — R05 Cough: Secondary | ICD-10-CM

## 2017-01-21 DIAGNOSIS — R053 Chronic cough: Secondary | ICD-10-CM | POA: Insufficient documentation

## 2017-01-21 NOTE — Progress Notes (Signed)
@Patient  ID: Ronald Long, male    DOB: 1960-12-26, 56 y.o.   MRN: [id number]  Chief Complaint  Patient presents with  . Follow-up    Cough    Referring provider: Roe Coombs*  HPI: 56 year old male former smoker seen for pulmonary consult March 2018 for chronic cough that began in September 2016 , possibly ACE inhibitor induced cough. Found to have OSA 09/2016  TEST  HST 09/2016 > AHI 34.8/hr PFT 01/14/2017 normal. PFT with FEV1 at 96%, ratio 83, FVC 89%, no significant bronchodilator response, DLCO 97%.  01/21/2017 Follow up : Cough and OSA  Returns for a four-month follow-up. Patient was seen earlier this year for pulmonary consult for chronic cough that began in September 2016.  ACE inhibitor was stopped. Patient says that his cough is improved. Patient was set up for PFTs that were done on 01/14/2017 that were normal with no airflow obstructive. FEV1 was 96%, ratio 83, no significant pocket daily response, FVC 89%, DLCO 97%. Marland Kitchen He was started on Symbicort but has stopped it. [place] Spaniel it was too expensive . Did not notice any change in symptoms off symbicort.    Patient was found to have sleep apnea. Home sleep study May 2018 that showed severe sleep apnea with AHI 34.8. Patient was started on C Pap. Patient says he is doing well on C Pap. He feels more rested. With less daytime sleepiness. Download shows excellent compliance with average usage at around 97%. He uses it for about 7 hours each night. Patient is on AutoSet 5-15 tablet H2O. AHI is 1.1. No significant leaks.   No Known Allergies   There is no immunization history on file for this patient.  Past Medical History:  Diagnosis Date  . Anxiety   . Diverticulitis   . Hypertension     Tobacco History: History  Smoking Status  . Former Smoker  . Packs/day: 1.00  . Years: 15.00  . Quit date: 01/21/2005  Smokeless Tobacco  . Never Used   Counseling given: Not Answered   Outpatient Encounter Prescriptions as  of 01/21/2017  Medication Sig  . ALPRAZolam (XANAX) 0.5 MG tablet TK 1 T PO TID PRN FOR ANXIETY  . amLODipine (NORVASC) 5 MG tablet   . Azelastine-Fluticasone (DYMISTA) 137-50 MCG/ACT SUSP Place 2 sprays into the nose daily.  . benzonatate (TESSALON) 200 MG capsule Take 1 capsule (200 mg total) by mouth 2 (two) times daily as needed for cough.  Marland Kitchen HYDROcodone-homatropine (HYCODAN) 5-1.5 MG/5ML syrup Take 5 mLs by mouth every 6 (six) hours as needed for cough.  Marland Kitchen omeprazole (PRILOSEC) 40 MG capsule Take 1 capsule (40 mg total) by mouth 2 (two) times daily.  . sertraline (ZOLOFT) 100 MG tablet Take 100 mg by mouth daily.  . cyclobenzaprine (FLEXERIL) 10 MG tablet Take 1 tablet (10 mg total) by mouth 2 (two) times daily as needed for muscle spasms. (Patient not taking: Reported on 01/21/2017)  . ibuprofen (ADVIL,MOTRIN) 200 MG tablet Take 200 mg by mouth every 6 (six) hours as needed.  . [DISCONTINUED] benazepril (LOTENSIN) 40 MG tablet Take 20 mg by mouth daily. Patient cuts this medication in half, and takes 20 milligrams of this medication.  . [DISCONTINUED] budesonide-formoterol (SYMBICORT) 80-4.5 MCG/ACT inhaler Inhale 2 puffs into the lungs 2 (two) times daily. (Patient not taking: Reported on 01/21/2017)  . [DISCONTINUED] budesonide-formoterol (SYMBICORT) 80-4.5 MCG/ACT inhaler Inhale 2 puffs into the lungs 2 (two) times daily.  . [DISCONTINUED] oxyCODONE-acetaminophen (PERCOCET/ROXICET) 5-325 MG per tablet Take 1-2  tablets by mouth every 6 (six) hours as needed for pain. (Patient not taking: Reported on 01/21/2017)  . [DISCONTINUED] predniSONE (DELTASONE) 10 MG tablet 4 tabs x 2 days, 3 tabs x 2 days, 2 tabs x 2 days, 1 tabs x 2 days then stop. (Patient not taking: Reported on 01/21/2017)   No facility-administered encounter medications on file as of 01/21/2017.      Review of Systems  Constitutional:   No  weight loss, night sweats,  Fevers, chills,  +fatigue, or  lassitude.  HEENT:   No  headaches,  Difficulty swallowing,  Tooth/dental problems, or  Sore throat,                No sneezing, itching, ear ache,  +nasal congestion, post nasal drip,   CV:  No chest pain,  Orthopnea, PND, swelling in lower extremities, anasarca, dizziness, palpitations, syncope.   GI  No heartburn, indigestion, abdominal pain, nausea, vomiting, diarrhea, change in bowel habits, loss of appetite, bloody stools.   Resp: No shortness of breath with exertion or at rest.  No excess mucus, no productive cough,  No non-productive cough,  No coughing up of blood.  No change in color of mucus.  No wheezing.  No chest wall deformity  Skin: no rash or lesions.  GU: no dysuria, change in color of urine, no urgency or frequency.  No flank pain, no hematuria   MS:  No joint pain or swelling.  No decreased range of motion.  No back pain.    Physical Exam  BP 128/84 (BP Location: Left Arm, Cuff Size: Normal)   Pulse 68   Ht 5\' 9"  (1.753 m)   Wt 258 lb 9.6 oz (117.3 kg)   SpO2 97%   BMI 38.19 kg/m   GEN: A/Ox3; pleasant , NAD, obese    HEENT:  [place]/AT,  EACs-clear, TMs-wnl, NOSE-clear, THROAT-clear, no lesions, no postnasal drip or exudate noted. Class 2-3 MP airway   NECK:  Supple w/ fair ROM; no JVD; normal carotid impulses w/o bruits; no thyromegaly or nodules palpated; no lymphadenopathy.    RESP  Clear  P & A; w/o, wheezes/ rales/ or rhonchi. no accessory muscle use, no dullness to percussion  CARD:  RRR, no m/r/g, no peripheral edema, pulses intact, no cyanosis or clubbing.  GI:   Soft & nt; nml bowel sounds; no organomegaly or masses detected.   Musco: Warm bil, no deformities or joint swelling noted.   Neuro: alert, no focal deficits noted.    Skin: Warm, no lesions or rashes    Lab Results:  CBC  BNP No results found for: BNP  ProBNP No results found for: PROBNP  Imaging: No results found.   Assessment & Plan:   Chronic cough Chronic cough , most likely secondary to  ACE inhibitor , +/- GERD/AR  Much improved off ACE  PFT was normal , no change off symbicort, may d/c   Plan  Cont on current reigmen  GERD diet /rx .  Delsym As needed    OSA (obstructive sleep apnea) Severe OSA -well controlled on CPAP   Plan  Cont on CPAP ,  Wt loss        Rubye Oaks, NP 01/21/2017

## 2017-01-21 NOTE — Assessment & Plan Note (Addendum)
Severe OSA -well controlled on CPAP   Plan  Cont on CPAP ,  Wt loss

## 2017-01-21 NOTE — Patient Instructions (Signed)
May remain off Symbicort .  May use Delsym 2 tsp Twice daily  As needed  Cough.  Continue on CPAP At bedtime   Keep up good work  Work on weight loss.  Do not drive if sleepy .  Follow up Dr. Isaiah Serge  Or Parrett NP in 4 months and As needed

## 2017-01-21 NOTE — Assessment & Plan Note (Addendum)
Chronic cough , most likely secondary to ACE inhibitor , +/- GERD/AR  Much improved off ACE  PFT was normal , no change off symbicort, may d/c   Plan  Cont on current reigmen  GERD diet /rx .  Delsym As needed

## 2017-02-27 ENCOUNTER — Other Ambulatory Visit: Payer: Self-pay

## 2017-02-27 MED ORDER — OMEPRAZOLE 40 MG PO CPDR: 40.0000 mg | DELAYED_RELEASE_CAPSULE | Freq: Two times a day (BID) | ORAL | 3 refills | Status: DC

## 2017-02-27 NOTE — Telephone Encounter (Signed)
Received refill request from [facility] for Prilosec. Last OV 01/21/17. Rx has been sent to preferred pharmacy. Nothing further needed.

## 2017-06-19 ENCOUNTER — Telehealth: Payer: Self-pay | Admitting: Pulmonary Disease

## 2017-06-19 NOTE — Telephone Encounter (Signed)
Called and spoke with Ronald Long with [facility] and he gave me the number for Ronald Long---fax number to send the last OV note to [phone]  OV note has been faxed and nothing further is needed.

## 2017-07-18 ENCOUNTER — Other Ambulatory Visit: Payer: Self-pay | Admitting: *Deleted

## 2017-07-18 MED ORDER — OMEPRAZOLE 40 MG PO CPDR: 40.0000 mg | DELAYED_RELEASE_CAPSULE | Freq: Two times a day (BID) | ORAL | 2 refills | Status: DC

## 2017-09-11 ENCOUNTER — Other Ambulatory Visit: Payer: Self-pay | Admitting: Pulmonary Disease

## 2017-09-11 MED ORDER — OMEPRAZOLE 40 MG PO CPDR: 40.0000 mg | DELAYED_RELEASE_CAPSULE | Freq: Two times a day (BID) | ORAL | 3 refills | Status: AC

## 2017-09-11 NOTE — Telephone Encounter (Signed)
[facility]. Patient has had a recent ov.  Will send refill.

## 2018-09-02 ENCOUNTER — Other Ambulatory Visit: Payer: Self-pay | Admitting: Pulmonary Disease

## 2021-12-10 ENCOUNTER — Other Ambulatory Visit: Payer: Self-pay

## 2021-12-10 ENCOUNTER — Encounter (HOSPITAL_BASED_OUTPATIENT_CLINIC_OR_DEPARTMENT_OTHER): Payer: Self-pay

## 2021-12-10 ENCOUNTER — Emergency Department (HOSPITAL_BASED_OUTPATIENT_CLINIC_OR_DEPARTMENT_OTHER): Payer: 59 | Admitting: Radiology

## 2021-12-10 ENCOUNTER — Emergency Department (HOSPITAL_BASED_OUTPATIENT_CLINIC_OR_DEPARTMENT_OTHER)
Admission: EM | Admit: 2021-12-10 | Discharge: 2021-12-10 | Disposition: A | Discharge status: Home / Self Care | Payer: 59 | Attending: Emergency Medicine | Admitting: Emergency Medicine

## 2021-12-10 DIAGNOSIS — S99912A Unspecified injury of left ankle, initial encounter: Secondary | ICD-10-CM | POA: Diagnosis present

## 2021-12-10 DIAGNOSIS — S82892A Other fracture of left lower leg, initial encounter for closed fracture: Secondary | ICD-10-CM | POA: Diagnosis not present

## 2021-12-10 DIAGNOSIS — X501XXA Overexertion from prolonged static or awkward postures, initial encounter: Secondary | ICD-10-CM | POA: Insufficient documentation

## 2021-12-10 DIAGNOSIS — Y9368 Activity, volleyball (beach) (court): Secondary | ICD-10-CM | POA: Insufficient documentation

## 2021-12-10 MED ORDER — OXYCODONE-ACETAMINOPHEN 5-325 MG PO TABS: 1.0000 | ORAL_TABLET | Freq: Four times a day (QID) | ORAL | 0 refills | Status: AC | PRN

## 2021-12-10 MED ORDER — ONDANSETRON 4 MG PO TBDP: 4.0000 mg | ORAL_TABLET | Freq: Three times a day (TID) | ORAL | 0 refills | Status: AC | PRN

## 2021-12-10 MED ORDER — ONDANSETRON 4 MG PO TBDP
4.0000 mg | ORAL_TABLET | Freq: Once | ORAL | Status: AC
  Administered 2021-12-10: 4 mg via ORAL
  Filled 2021-12-10: qty 1

## 2021-12-10 MED ORDER — OXYCODONE-ACETAMINOPHEN 5-325 MG PO TABS
1.0000 | ORAL_TABLET | Freq: Once | ORAL | Status: AC
  Administered 2021-12-10: 1 via ORAL
  Filled 2021-12-10: qty 1

## 2021-12-10 MED ORDER — IBUPROFEN 800 MG PO TABS
800.0000 mg | ORAL_TABLET | Freq: Once | ORAL | Status: AC | PRN
  Administered 2021-12-10: 800 mg via ORAL
  Filled 2021-12-10: qty 1

## 2021-12-10 NOTE — ED Notes (Signed)
Reviewed AVS/discharge instruction with patient. Time allotted for and all questions answered. Patient is agreeable for d/c and escorted to ed exit by staff.  

## 2021-12-10 NOTE — ED Notes (Signed)
Patient states he has crutches at home and does not want to take crutches with him

## 2021-12-10 NOTE — ED Triage Notes (Signed)
Patient here POV from Home.  Endorses Sustaining Left Ankle and Foot Injury while Playing Volleyball today approximately 1 hour ago. Landed on it Incorrectly. No Other Injuries.   Uncomfortable during Triage. A&Ox4. GCS 15. BIB Wheelchair.

## 2021-12-10 NOTE — Discharge Instructions (Addendum)
XR imaging: IMPRESSION:  Possible cortical avulsion between the fibular malleolus and the  inferolateral talus.   Please follow-up with orthopedics for this. Recommend rest, ice, elevation of the extremity, and NSAIDs and opiates for pain control.

## 2021-12-10 NOTE — ED Provider Notes (Signed)
[facility] EMERGENCY DEPT Provider Note   CSN: [id number] Arrival date & time: 12/10/21  1950     History {Add pertinent medical, surgical, social history, OB history to HPI:1} Chief Complaint  Patient presents with   Ankle Pain    Ronald Long is a 61 y.o. male.   Ankle Pain      Home Medications Prior to Admission medications   Medication Sig Start Date End Date Taking? Authorizing Provider  ALPRAZolam (XANAX) 0.5 MG tablet TK 1 T PO TID PRN FOR ANXIETY 07/06/16   [provider]  amLODipine (NORVASC) 5 MG tablet  09/01/16   [provider]  Azelastine-Fluticasone (DYMISTA) 137-50 MCG/ACT SUSP Place 2 sprays into the nose daily. 08/08/16   Mannam, Colbert Coyer, MD  benzonatate (TESSALON) 200 MG capsule Take 1 capsule (200 mg total) by mouth 2 (two) times daily as needed for cough. 12/06/16   Mannam, Colbert Coyer, MD  cyclobenzaprine (FLEXERIL) 10 MG tablet Take 1 tablet (10 mg total) by mouth 2 (two) times daily as needed for muscle spasms. Patient not taking: Reported on 01/21/2017 10/25/12   Vanetta Mulders, MD  HYDROcodone-homatropine [facility])) 5-1.5 MG/5ML syrup Take 5 mLs by mouth every 6 (six) hours as needed for cough. 10/31/16   Mannam, Colbert Coyer, MD  ibuprofen (ADVIL,MOTRIN) 200 MG tablet Take 200 mg by mouth every 6 (six) hours as needed.    [provider]  omeprazole (PRILOSEC) 40 MG capsule Take 1 capsule (40 mg total) by mouth 2 (two) times daily. Needs appointment in office for future refills 09/11/17   Chilton Greathouse, MD  sertraline (ZOLOFT) 100 MG tablet Take 100 mg by mouth daily.    [provider]      Allergies    Patient has no known allergies.    Review of Systems   Review of Systems  Physical Exam Updated Vital Signs BP 137/83 (BP Location: Right Arm)   Pulse 77   Temp 97.9 F (36.6 C)   Resp (!) 21   Ht 5\' 9"  (1.753 m)   Wt 117.3 kg   SpO2 97%   BMI 38.19 kg/m  Physical Exam  ED Results / Procedures /  Treatments   Labs (all labs ordered are listed, but only abnormal results are displayed) Labs Reviewed - No data to display  EKG None  Radiology DG Foot Complete Left  Result Date: 12/10/2021 CLINICAL DATA:  Fall EXAM: LEFT FOOT - COMPLETE 3+ VIEW COMPARISON:  None Available. FINDINGS: No fracture or malalignment.  The soft tissues are unremarkable. IMPRESSION: No acute osseous abnormality Electronically Signed   By: 12/12/2021 M.D.   On: 12/10/2021 21:02   DG Ankle Complete Left  Result Date: 12/10/2021 CLINICAL DATA:  Ankle and foot pain EXAM: LEFT ANKLE COMPLETE - 3+ VIEW COMPARISON:  None Available. FINDINGS: No malalignment. Possible small cortical avulsion between the fibular malleolus and the inferolateral talus. The mortise is symmetric. There is lateral soft tissue swelling IMPRESSION: Possible cortical avulsion between the fibular malleolus and the inferolateral talus. Electronically Signed   By: 12/12/2021 M.D.   On: 12/10/2021 21:01    Procedures Procedures  {Document cardiac monitor, telemetry assessment procedure when appropriate:1}  Medications Ordered in ED Medications  ibuprofen (ADVIL) tablet 800 mg (800 mg Oral Given 12/10/21 2029)    ED Course/ Medical Decision Making/ A&P  Medical Decision Making Amount and/or Complexity of Data Reviewed Radiology: ordered.  Risk Prescription drug management.   ***  {Document critical care time when appropriate:1} {Document review of labs and clinical decision tools ie heart score, Chads2Vasc2 etc:1}  {Document your independent review of radiology images, and any outside records:1} {Document your discussion with family members, caretakers, and with consultants:1} {Document social determinants of health affecting pt's care:1} {Document your decision making why or why not admission, treatments were needed:1} Final Clinical Impression(s) / ED Diagnoses Final diagnoses:  None    Rx / DC  Orders ED Discharge Orders     None

## 2023-02-17 ENCOUNTER — Other Ambulatory Visit (HOSPITAL_BASED_OUTPATIENT_CLINIC_OR_DEPARTMENT_OTHER): Payer: Self-pay

## 2023-02-17 MED ORDER — WEGOVY 0.25 MG/0.5ML ~~LOC~~ SOAJ
0.2500 mg | SUBCUTANEOUS | 0 refills | Status: AC
  Filled 2023-02-17: qty 2, 28d supply, fill #0

## 2023-02-18 ENCOUNTER — Encounter (HOSPITAL_BASED_OUTPATIENT_CLINIC_OR_DEPARTMENT_OTHER): Payer: Self-pay

## 2023-02-18 ENCOUNTER — Other Ambulatory Visit (HOSPITAL_BASED_OUTPATIENT_CLINIC_OR_DEPARTMENT_OTHER): Payer: Self-pay

## 2023-02-19 ENCOUNTER — Other Ambulatory Visit (HOSPITAL_BASED_OUTPATIENT_CLINIC_OR_DEPARTMENT_OTHER): Payer: Self-pay

## 2023-02-27 ENCOUNTER — Other Ambulatory Visit (HOSPITAL_BASED_OUTPATIENT_CLINIC_OR_DEPARTMENT_OTHER): Payer: Self-pay

## 2023-08-11 ENCOUNTER — Encounter: Payer: Self-pay | Admitting: Professional Counselor

## 2023-08-11 ENCOUNTER — Ambulatory Visit: Admitting: Professional Counselor

## 2023-08-11 DIAGNOSIS — F411 Generalized anxiety disorder: Secondary | ICD-10-CM | POA: Diagnosis not present

## 2023-08-11 DIAGNOSIS — F330 Major depressive disorder, recurrent, mild: Secondary | ICD-10-CM | POA: Diagnosis not present

## 2023-08-11 NOTE — Progress Notes (Signed)
 [facility] Counselor Initial Adult Exam  Name: Ronald Long Date: 08/11/2023 MRN: [id number] DOB: [birth year] PCP: [facility]  Time spent: 1:10 PM to 2:13 PM  Guardian/Payee:  pt    Paperwork requested:  No   Reason for Visit /Presenting Problem: Anxiety, depression  Mental Status Exam:    Appearance:   Neat     Behavior:  Appropriate and Sharing  Motor:  Normal  Speech/Language:   Clear and Coherent and Normal Rate  Affect:  Appropriate and Congruent  Mood:  normal  Thought process:  normal  Thought content:    WNL  Sensory/Perceptual disturbances:    WNL  Orientation:  oriented to person, place, time/date, and situation  Attention:  Good  Concentration:  Good  Memory:  WNL  Fund of knowledge:   Good  Insight:    Good  Judgment:   Good  Impulse Control:  Good   Reported Symptoms: Anhedonia, low mood, sleep concerns, fatigue, low self-esteem, trouble concentrating, restlessness, nervousness, worries, fears, trouble relaxing, irritability, catastrophic thinking, impulsivity, risky behavior, mind racing, grief/loss, interpersonal concerns, panic attacks related to dental anxiety  Risk Assessment: Danger to Self:  No Self-injurious Behavior: No Danger to Others: No Duty to Warn:no Physical Aggression / Violence:No  Access to Firearms a concern: No  Gang Involvement:No  Patient / guardian was educated about steps to take if suicide or homicide risk level increases between visits: n/a While future psychiatric events cannot be accurately predicted, the patient does not currently require acute inpatient psychiatric care and does not currently meet [facility] involuntary commitment criteria.  Substance Abuse History: Current substance abuse: No     Past Psychiatric History:   Previous psychological history is significant for anxiety and depression Outpatient Providers: couples counseling by hx, and individual History of Psych  Hospitalization: No  Psychological Testing:  n/a    Abuse History: Victim of No.,  n/a    Report needed: No. Victim of Neglect:No. Perpetrator of  n/a   Witness / Exposure to Domestic Violence: No   Protective Services Involvement: No  Witness to [employer] Violence:  No   Family History:  Family History  Problem Relation Age of Onset   Diabetes Mother    Cancer Mother    Allergic rhinitis Father    Diabetes Sister    Cancer Brother    Heart disease Maternal Grandmother    Cancer Maternal Grandmother    Kidney disease Maternal Grandmother    Heart disease Maternal Grandfather    Lung cancer Maternal Grandfather    Heart disease Paternal Grandmother    Parkinson's disease Paternal Grandfather    Skin cancer Maternal Uncle   Daughters: anxiety Mother, maternal grandmother: anxiety  Living situation: the patient lives with their family  Sexual Orientation:  Straight  Relationship Status: married               If a parent, number of children / ages:  two 66 dtrs (twins)  Lawyer; spouse, friends  Surveyor, quantity Stress:  Yes   Income/Employment/Disability: Employment  Financial planner: No   Educational History: Education: Designer, industrial/product  Religion/Sprituality/World View:    Christian  Any cultural differences that may affect / interfere with treatment:  not applicable   Recreation/Hobbies: golf by history, music, Geographical information systems officer  Stressors:Financial difficulties   Health problems   Marital or family conflict    Strengths:  Supportive Relationships, Family, Friends, Warehouse manager, Bible study, spirituality, Able to [employer], and self-awareness, resiliency,  resourcefulness  Barriers:  n/a   Legal History: Pending legal issue / charges: The patient has no significant history of legal issues. History of legal issue / charges:  n/a  Medical History/Surgical History: reviewed Past Medical History:  Diagnosis Date   Anxiety     Diverticulitis    Hypertension     Past Surgical History:  Procedure Laterality Date   COLON RESECTION     SKIN BIOPSY      Medications: Current Outpatient Medications  Medication Sig Dispense Refill   ALPRAZolam (XANAX) 0.5 MG tablet TK 1 T PO TID PRN FOR ANXIETY  2   amLODipine (NORVASC) 5 MG tablet   5   Azelastine-Fluticasone (DYMISTA) 137-50 MCG/ACT SUSP Place 2 sprays into the nose daily. 1 Bottle 3   benzonatate (TESSALON) 200 MG capsule Take 1 capsule (200 mg total) by mouth 2 (two) times daily as needed for cough. 30 capsule 1   cyclobenzaprine (FLEXERIL) 10 MG tablet Take 1 tablet (10 mg total) by mouth 2 (two) times daily as needed for muscle spasms. (Patient not taking: Reported on 01/21/2017) 20 tablet 0   HYDROcodone-homatropine (HYCODAN) 5-1.5 MG/5ML syrup Take 5 mLs by mouth every 6 (six) hours as needed for cough. 240 mL 0   ibuprofen (ADVIL,MOTRIN) 200 MG tablet Take 200 mg by mouth every 6 (six) hours as needed.     omeprazole (PRILOSEC) 40 MG capsule Take 1 capsule (40 mg total) by mouth 2 (two) times daily. Needs appointment in office for future refills 60 capsule 3   ondansetron (ZOFRAN-ODT) 4 MG disintegrating tablet Take 1 tablet (4 mg total) by mouth every 8 (eight) hours as needed for nausea or vomiting. 20 tablet 0   oxyCODONE-acetaminophen (PERCOCET/ROXICET) 5-325 MG tablet Take 1 tablet by mouth every 6 (six) hours as needed for severe pain. 15 tablet 0   Semaglutide-Weight Management (WEGOVY) 0.25 MG/0.5ML SOAJ Inject 0.25 mg into the skin every 7 (seven) days. 2 mL 0   sertraline (ZOLOFT) 100 MG tablet Take 100 mg by mouth daily.     No current facility-administered medications for this visit.    No Known Allergies  Diagnoses:    ICD-10-CM   1. Generalized anxiety disorder  F41.1     2. Major depressive disorder, recurrent episode, mild (HCC)  F33.0       Treatment Provided: Counselor provided person-centered counseling including building a  rapport, active listening; facilitation of PHQ-9 with a score of 11, GAD-7 with a score of 18, MDQ 3/13.  Patient presented to session to address concerns of anxiety and depression, and process addiction concerns per gambling.  He did not endorse experience of mania.  He voiced experience of depression since high school, with anxiety in adulthood and related to experience of bankruptcy at its onset.  He reported prescription of sertraline to be helpful in alleviating symptomology.  Patient shared regarding trajectory of gambling, explaining how it had begun as recreational activity with his wife, but evolved into something he found difficult to control.  He voiced that he and wife work on [employer] and that even within this he has trouble not spending on gambling.  He identified issue as greatly impacting marital and family wellness due to conflict with wife and financial strain.  Counselor provided psychoeducation on process addictions, and concepts of immediate gratification, harm reduction, restraint collapse, and healthy substitutions.  Counselor and patient discussed patient counseling goals which included desire to quit gambling and to rebuild trust in marriage.  Patient identified strong sense of gender roles including himself as provider for family.  He also identified other areas of difficulty in marriage, and centrality of faith to his values orientation.  Plan of Care: Patient is scheduled for follow-up; continue to build rapport, assess symptoms and history, discuss treatment plan and obtain consent.  Gaspar Bidding, [facility]

## 2023-08-15 ENCOUNTER — Other Ambulatory Visit (HOSPITAL_BASED_OUTPATIENT_CLINIC_OR_DEPARTMENT_OTHER): Payer: Self-pay

## 2023-08-15 MED ORDER — WEGOVY 0.25 MG/0.5ML ~~LOC~~ SOAJ
0.2500 mg | SUBCUTANEOUS | 0 refills | Status: DC
  Filled 2023-08-15: qty 6, 84d supply, fill #0
  Filled 2023-08-15: qty 2, 28d supply, fill #0
  Filled 2023-09-01: qty 2, 28d supply, fill #1

## 2023-08-30 ENCOUNTER — Encounter (HOSPITAL_BASED_OUTPATIENT_CLINIC_OR_DEPARTMENT_OTHER): Payer: Self-pay

## 2023-09-01 ENCOUNTER — Other Ambulatory Visit (HOSPITAL_BASED_OUTPATIENT_CLINIC_OR_DEPARTMENT_OTHER): Payer: Self-pay

## 2023-09-03 ENCOUNTER — Other Ambulatory Visit (HOSPITAL_BASED_OUTPATIENT_CLINIC_OR_DEPARTMENT_OTHER): Payer: Self-pay

## 2023-09-03 MED ORDER — WEGOVY 0.5 MG/0.5ML ~~LOC~~ SOAJ
0.5000 mg | SUBCUTANEOUS | 0 refills | Status: DC
  Filled 2023-09-03 – 2023-09-06 (×2): qty 2, 28d supply, fill #0

## 2023-09-04 ENCOUNTER — Other Ambulatory Visit (HOSPITAL_BASED_OUTPATIENT_CLINIC_OR_DEPARTMENT_OTHER): Payer: Self-pay

## 2023-09-05 ENCOUNTER — Other Ambulatory Visit (HOSPITAL_BASED_OUTPATIENT_CLINIC_OR_DEPARTMENT_OTHER): Payer: Self-pay

## 2023-09-06 ENCOUNTER — Other Ambulatory Visit (HOSPITAL_BASED_OUTPATIENT_CLINIC_OR_DEPARTMENT_OTHER): Payer: Self-pay

## 2023-09-08 ENCOUNTER — Other Ambulatory Visit (HOSPITAL_BASED_OUTPATIENT_CLINIC_OR_DEPARTMENT_OTHER): Payer: Self-pay

## 2023-09-17 ENCOUNTER — Ambulatory Visit: Payer: 59 | Admitting: Professional Counselor

## 2023-09-17 ENCOUNTER — Encounter: Payer: Self-pay | Admitting: Professional Counselor

## 2023-09-17 DIAGNOSIS — F411 Generalized anxiety disorder: Secondary | ICD-10-CM | POA: Diagnosis not present

## 2023-09-17 DIAGNOSIS — F330 Major depressive disorder, recurrent, mild: Secondary | ICD-10-CM | POA: Diagnosis not present

## 2023-09-17 NOTE — Progress Notes (Signed)
      [facility] Counselor/Therapist Progress Note  Patient ID: Ronald Long, MRN: [id number],    Date: 09/17/2023  Time Spent: 9:15 AM to 10:11 AM  Treatment Type: Individual Therapy  Reported Symptoms: Restlessness, nervousness, worries, interpersonal concerns, stress, impulsive tendencies, preoccupying thoughts, low mood, irritability  Mental Status Exam:  Appearance:   Neat     Behavior:  Appropriate, Sharing, and Motivated  Motor:  Normal  Speech/Language:   Clear and Coherent and Normal Rate  Affect:  Appropriate and Congruent  Mood:  normal  Thought process:  normal  Thought content:    WNL  Sensory/Perceptual disturbances:    WNL  Orientation:  oriented to person, place, time/date, and situation  Attention:  Good  Concentration:  Good  Memory:  WNL  Fund of knowledge:   Good  Insight:    Good  Judgment:   Good  Impulse Control:  Good   Risk Assessment: Danger to Self:  No Self-injurious Behavior: No Danger to Others: No Duty to Warn:no Physical Aggression / Violence:No  Access to Firearms a concern: No  Gang Involvement:No   Subjective: Patient presented to session to address concerns of anxiety and depression.  He reported mixed progress at this time.  Patient and counselor discussed patient counseling goals.  He identified desire to reduce anxious symptomology specifically urges around impulsive behavior pertaining to gambling.  He also voiced desire to restore trust and interpersonal effectiveness marital relationship.  Patient gave his consent to treatment plan.  Counselor and patient discussed patient's recent family trip where he was able to gamble with his wife and he identified having enjoyed activity with her and vice versa.  He also identified not having procured more money to gamble more than was allotted.  Counselor affirmed and reinforced patient progress.  Counselor and patient discussed trajectory of behavior pattern, and marital  history.  Interventions: Solution-Oriented/Positive Psychology, Humanistic/Existential, and Insight-Oriented  Diagnosis:   ICD-10-CM   1. Generalized anxiety disorder  F41.1     2. Major depressive disorder, recurrent episode, mild (HCC)  F33.0       Plan: Patient is scheduled for follow-up; continue process work and developing coping skills.  Patient short-term goal between sessions to be mindful of impulsive tendencies and observe guardrails to harm reduction and/or abstinence.  Progress note was dictated with Dragon and reviewed for accuracy.  Anthon Kins, [facility]

## 2023-10-06 ENCOUNTER — Other Ambulatory Visit (HOSPITAL_BASED_OUTPATIENT_CLINIC_OR_DEPARTMENT_OTHER): Payer: Self-pay

## 2023-10-06 MED ORDER — WEGOVY 1 MG/0.5ML ~~LOC~~ SOAJ
1.0000 mg | SUBCUTANEOUS | 0 refills | Status: DC
  Filled 2023-10-06: qty 2, 28d supply, fill #0

## 2023-10-10 ENCOUNTER — Ambulatory Visit: Admitting: Professional Counselor

## 2023-10-10 ENCOUNTER — Encounter: Payer: Self-pay | Admitting: Professional Counselor

## 2023-10-10 DIAGNOSIS — F411 Generalized anxiety disorder: Secondary | ICD-10-CM

## 2023-10-10 DIAGNOSIS — F330 Major depressive disorder, recurrent, mild: Secondary | ICD-10-CM | POA: Diagnosis not present

## 2023-10-10 NOTE — Progress Notes (Signed)
      [facility] Counselor/Therapist Progress Note  Patient ID: Ronald Long, MRN: [id number],    Date: 10/10/2023  Time Spent: 3:07 PM to 4:10 PM  Treatment Type: Individual Therapy  Reported Symptoms: Worries, stress, catastrophic thinking, impulsive urges, fears, automatic negative thoughts, self-esteem concerns, interpersonal concerns  Mental Status Exam:  Appearance:   Neat     Behavior:  Appropriate and Sharing, Motivated  Motor:  Normal  Speech/Language:   Clear and Coherent and Normal Rate  Affect:  Appropriate and Congruent  Mood:  normal  Thought process:  normal  Thought content:    WNL  Sensory/Perceptual disturbances:    WNL  Orientation:  oriented to person, place, time/date, and situation  Attention:  Good  Concentration:  Good  Memory:  WNL  Fund of knowledge:   Good  Insight:    Good  Judgment:   Good  Impulse Control:  Good   Risk Assessment: Danger to Self:  No Self-injurious Behavior: No Danger to Others: No Duty to Warn:no Physical Aggression / Violence:No  Access to Firearms a concern: No  Gang Involvement:No   Subjective: Patient presented to session to address concerns of anxiety and depression.  He reported mixed progress at this time.  Patient continued to share regarding his personal history and challenges.  He processed the experience of his faith journey and marital relationship trajectory.  Counselor and patient discussed patient coping skills including golfing, listening to music, time with his family, and how quality time with wife helps him mitigate impulsive urges around gambling.  Counselor help facilitate insight around patient readiness to self disparage, and patient and counselor discussed pattern of negative self identification for patient including as relates youth.  Counselor and patient discussed patient needs for fulfillment and drivers for impulses.  Counselor provided psychoeducation on process addiction, and patient identified  resonance with his experience.  Interventions: Solution-Oriented/Positive Psychology, Humanistic/Existential, Psycho-education/Bibliotherapy, and Insight-Oriented  Diagnosis:   ICD-10-CM   1. Generalized anxiety disorder  F41.1     2. Major depressive disorder, recurrent episode, mild (HCC)  F33.0       Plan: Patient is scheduled for follow-up; continue to build rapport, facilitate process work and develop coping skills.  STG between sessions for patient to resource positive self talk and healthy coping activities, and reflect on and visualize facets of fulfillment across spheres of life.  Progress note was dictated with Dragon and reviewed for accuracy.  Ronald Long, [facility]

## 2023-11-03 ENCOUNTER — Other Ambulatory Visit (HOSPITAL_BASED_OUTPATIENT_CLINIC_OR_DEPARTMENT_OTHER): Payer: Self-pay

## 2023-11-03 MED ORDER — SEMAGLUTIDE-WEIGHT MANAGEMENT 1.7 MG/0.75ML ~~LOC~~ SOAJ
1.7000 mg | SUBCUTANEOUS | 0 refills | Status: AC
  Filled 2023-11-03: qty 3, 28d supply, fill #0

## 2023-11-19 ENCOUNTER — Ambulatory Visit (INDEPENDENT_AMBULATORY_CARE_PROVIDER_SITE_OTHER): Admitting: Professional Counselor

## 2023-11-19 ENCOUNTER — Encounter: Payer: Self-pay | Admitting: Professional Counselor

## 2023-11-19 DIAGNOSIS — F330 Major depressive disorder, recurrent, mild: Secondary | ICD-10-CM | POA: Diagnosis not present

## 2023-11-19 DIAGNOSIS — F411 Generalized anxiety disorder: Secondary | ICD-10-CM

## 2023-11-19 NOTE — Progress Notes (Signed)
      [facility] Counselor/Therapist Progress Note  Patient ID: Ronald Long, MRN: [id number],    Date: 11/19/2023  Time Spent: 4:12 PM to 5:14 PM  Treatment Type: Individual Therapy  Reported Symptoms: Stress, worries, fatigue, intermittent low mood, self-esteem concerns, phase of life concerns, interpersonal concerns  Mental Status Exam:  Appearance:   Casual     Behavior:  Appropriate, Sharing, and Motivated  Motor:  Normal  Speech/Language:   Clear and Coherent and Normal Rate  Affect:  Appropriate and Congruent  Mood:  normal  Thought process:  normal  Thought content:    WNL  Sensory/Perceptual disturbances:    WNL  Orientation:  oriented to person, place, time/date, and situation  Attention:  Good  Concentration:  Good  Memory:  WNL  Fund of knowledge:   Good  Insight:    Good  Judgment:   Good  Impulse Control:  Good   Risk Assessment: Danger to Self:  No Self-injurious Behavior: No Danger to Others: No Duty to Warn:no Physical Aggression / Violence:No  Access to Firearms a concern: No  Gang Involvement:No   Subjective: Patient presented to session to address concerns of anxiety and depression.  He reported minimal progress at this time.  He reported work stress, Dietitian for daughters to go to school including working on finances, and continued marital stress as relates relationship but also matters of house and other life stressors.  Patient processed his experience of marital concerns including matters of intimacy, and self esteem and guilt concerns he carries unto himself.   Counselor and patient discussed history of he and wife's relationship including business relationship and meaning to personal life.  Counselor actively listened and helped patient process emotions and experience of events, affirmed patient feelings, and help patient with strategies for improvement with interpersonal concerns.  Counselor assisted patient in identifying his idea of an optimal  relationship, which included intimacy, companionship, playfulness. Counselor recommended prioritizing quality time and/or devotional time with wife, and consideration of couples counseling.  Interventions: Solution-Oriented/Positive Psychology, Humanistic/Existential, and Insight-Oriented  Diagnosis:   ICD-10-CM   1. Generalized anxiety disorder  F41.1     2. Major depressive disorder, recurrent episode, mild (HCC)  F33.0       Plan: Patient is scheduled for follow-up; continue process work and developing coping skills.  STG to discuss having family with patient session with wife, and continue to resource thought stopping skill set to abstain from gambling urges.  [name], [facility]

## 2023-12-01 ENCOUNTER — Other Ambulatory Visit (HOSPITAL_BASED_OUTPATIENT_CLINIC_OR_DEPARTMENT_OTHER): Payer: Self-pay

## 2023-12-01 MED ORDER — WEGOVY 2.4 MG/0.75ML ~~LOC~~ SOAJ
2.4000 mg | SUBCUTANEOUS | 2 refills | Status: DC
  Filled 2023-12-01: qty 3, 28d supply, fill #0

## 2023-12-02 ENCOUNTER — Other Ambulatory Visit (HOSPITAL_BASED_OUTPATIENT_CLINIC_OR_DEPARTMENT_OTHER): Payer: Self-pay

## 2023-12-17 ENCOUNTER — Ambulatory Visit: Admitting: Professional Counselor

## 2023-12-17 ENCOUNTER — Encounter: Payer: Self-pay | Admitting: Professional Counselor

## 2023-12-17 DIAGNOSIS — F411 Generalized anxiety disorder: Secondary | ICD-10-CM

## 2023-12-17 NOTE — Progress Notes (Signed)
      [facility] Counselor/Therapist Progress Note  Patient ID: Ronald Long, MRN: [id number],    Date: 12/17/2023  Time Spent: 4:15 PM - 5:17 PM   Treatment Type: Family with patient  Pt presented to session with spouse.  Reported Symptoms: nervousness, anxiousness, worries, stress, preoccupying thoughts, interpersonal concerns, phase of life concerns  Mental Status Exam:  Appearance:   Casual     Behavior:  Appropriate, Sharing, and Motivated  Motor:  Normal  Speech/Language:   Clear and Coherent and Normal Rate  Affect:  Appropriate and Congruent  Mood:  normal  Thought process:  normal  Thought content:    WNL  Sensory/Perceptual disturbances:    WNL  Orientation:  oriented to person, place, time/date, and situation  Attention:  Good  Concentration:  Good  Memory:  WNL  Fund of knowledge:   Good  Insight:    Good  Judgment:   Good  Impulse Control:  Good   Risk Assessment: Danger to Self:  No Self-injurious Behavior: No Danger to Others: No Duty to Warn:no Physical Aggression / Violence:No  Access to Firearms a concern: No  Gang Involvement:No   Subjective: Patient presented to session to address concerns of anxiety.  He presented to session with his spouse.  Patient and spouse discussed ways in which trust have been broken in relationship and counselor assisted patient and spouse in sharing feelings, increasing communication skills, and developing strategies to mitigate effects of shared stressors.  They discussed quality time, intimacy, nurturing of connection, resourcing genuine attention and interest in 1 another, talking regularly, and other strategies and ways to implement.  Patient and patient's spouse reflected on upcoming phase of life in which they will have a empty nest, and ways they want to do life together with increased fulfillment.  Interventions: Solution-Oriented/Positive Psychology, Humanistic/Existential, Insight-Oriented, and  Interpersonal  Diagnosis:   ICD-10-CM   1. Generalized anxiety disorder  F41.1       Plan: Pt is scheduled for a follow-up; continue process work and developing coping skills. STG between sessions to prioritize quality time, opportunities for connection and depth with spouse; reflect on empty nest phase of life and collaborative aspirations for marital well-being.  [name], [facility]

## 2023-12-29 ENCOUNTER — Other Ambulatory Visit (HOSPITAL_BASED_OUTPATIENT_CLINIC_OR_DEPARTMENT_OTHER): Payer: Self-pay

## 2023-12-29 MED ORDER — WEGOVY 2.4 MG/0.75ML ~~LOC~~ SOAJ
2.4000 mg | SUBCUTANEOUS | 2 refills | Status: AC
  Filled 2023-12-29: qty 3, 28d supply, fill #0
  Filled 2024-02-25: qty 3, 28d supply, fill #1
  Filled 2024-03-22 (×2): qty 3, 28d supply, fill #2

## 2023-12-30 ENCOUNTER — Other Ambulatory Visit (HOSPITAL_BASED_OUTPATIENT_CLINIC_OR_DEPARTMENT_OTHER): Payer: Self-pay

## 2024-01-14 ENCOUNTER — Ambulatory Visit (INDEPENDENT_AMBULATORY_CARE_PROVIDER_SITE_OTHER): Admitting: Professional Counselor

## 2024-01-14 ENCOUNTER — Encounter: Payer: Self-pay | Admitting: Professional Counselor

## 2024-01-14 DIAGNOSIS — F411 Generalized anxiety disorder: Secondary | ICD-10-CM | POA: Diagnosis not present

## 2024-01-14 DIAGNOSIS — F330 Major depressive disorder, recurrent, mild: Secondary | ICD-10-CM | POA: Diagnosis not present

## 2024-01-14 NOTE — Progress Notes (Unsigned)
      [facility] Counselor/Therapist Progress Note  Patient ID: Ronald Long, MRN: [id number],    Date: 01/14/2024  Time Spent: 3:06 PM - 4:05 PM   Treatment Type: Individual Therapy  Reported Symptoms: worries, stress, anxiousness, trouble relaxing, restlessness, automatic negative thoughts, low mood, fatigue, self esteem concerns, interpersonal concerns, phase of life concerns, addictive urges  Mental Status Exam:  Appearance:   Casual     Behavior:  Appropriate, Sharing, and Motivated  Motor:  Normal  Speech/Language:   Clear and Coherent and Normal Rate  Affect:  Appropriate and Congruent  Mood:  normal  Thought process:  normal  Thought content:    WNL  Sensory/Perceptual disturbances:    WNL  Orientation:  oriented to person, place, time/date, and situation  Attention:  Good  Concentration:  Good  Memory:  WNL  Fund of knowledge:   Good  Insight:    Good  Judgment:   Good  Impulse Control:  Good   Risk Assessment: Danger to Self:  No Self-injurious Behavior: No Danger to Others: No Duty to Warn:no Physical Aggression / Violence:No  Access to Firearms a concern: No  Gang Involvement:No   Subjective: Patient presented to session to address concerns of anxiety and depression.  He reported mixed progress at this time.  Counselor and patient discussed prior session which was a family session with patient and wife.  Patient identified having initiated a hug in case daily for he and wife, and for this practice to be implemented.  He processed this and other recent marital experiences and identified curiosity around circumstance of empty nest at this time, his daughters having started college and no longer living at home.  Counselor assisted patient with strategies for improving self-confidence in marital realm, helping patient to identify ways in which he was confident in his work role and how he might bring his capacity for leadership skills and assuredness from once year of  life into another.  Counselor also helped patient with strategies for increased romance including date nights, romantic getaways, and helped patient in identifying negative thought patterns and assisted with cognitive restructuring as relates to choosing to focus on/choose positive narratives where applicable.  Counselor and patient also discussed patient gambling urges and how gambling may allow for freedom from what he experiences as a great deal of pressure and control of finances in daily life.  Counselor and patient discussed restraint collapse concepts, and strategies for alleviating, such as choosing healthy substitutes as urges arise.  Interventions: Assertiveness/Communication, Solution-Oriented/Positive Psychology, Humanistic/Existential, and Insight-Oriented, CBT, Resourcing, Psychoeducation  Diagnosis:   ICD-10-CM   1. Generalized anxiety disorder  F41.1     2. Major depressive disorder, recurrent episode, mild (HCC)  F33.0       Plan: Pt is scheduled for a follow-up; continue process work and developing coping skills. STG between sessions to practice cognitive restructuring around negative thoughts per strategies discussed in session; practice interpersonal effectiveness strategies toward improved marital dynamics as discussed in session; practice healthy substitutions when addictive urges arise.  [name], [facility]

## 2024-01-27 ENCOUNTER — Other Ambulatory Visit (HOSPITAL_BASED_OUTPATIENT_CLINIC_OR_DEPARTMENT_OTHER): Payer: Self-pay

## 2024-01-27 MED ORDER — WEGOVY 2.4 MG/0.75ML ~~LOC~~ SOAJ
2.4000 mg | SUBCUTANEOUS | 2 refills | Status: DC
  Filled 2024-01-27: qty 3, 28d supply, fill #0

## 2024-02-25 ENCOUNTER — Other Ambulatory Visit (HOSPITAL_BASED_OUTPATIENT_CLINIC_OR_DEPARTMENT_OTHER): Payer: Self-pay

## 2024-03-18 ENCOUNTER — Encounter: Payer: Self-pay | Admitting: Professional Counselor

## 2024-03-18 ENCOUNTER — Ambulatory Visit: Admitting: Professional Counselor

## 2024-03-18 DIAGNOSIS — F330 Major depressive disorder, recurrent, mild: Secondary | ICD-10-CM | POA: Diagnosis not present

## 2024-03-18 DIAGNOSIS — F411 Generalized anxiety disorder: Secondary | ICD-10-CM

## 2024-03-18 NOTE — Progress Notes (Signed)
      [facility] Counselor/Therapist Progress Note  Patient ID: Ronald Long, MRN: [id number],    Date: 03/18/2024  Time Spent: 2:15 PM to 3:05 PM  Treatment Type: Family with patient  Patient presented to session with spouse.  Reported Symptoms: Nervousness, anxiousness, worries, stress, self-esteem concerns, interpersonal concerns, phase of life concerns  Mental Status Exam:  Appearance:   Neat     Behavior:  Appropriate, Sharing, and Motivated  Motor:  Normal  Speech/Language:   Clear and Coherent and Normal Rate  Affect:  Appropriate and Congruent  Mood:  normal  Thought process:  normal  Thought content:    WNL  Sensory/Perceptual disturbances:    WNL  Orientation:  oriented to person, place, time/date, and situation  Attention:  Good  Concentration:  Good  Memory:  WNL  Fund of knowledge:   Good  Insight:    Good  Judgment:   Good  Impulse Control:  Good   Risk Assessment: Danger to Self:  No Self-injurious Behavior: No Danger to Others: No Duty to Warn:no Physical Aggression / Violence:No  Access to Firearms a concern: No  Gang Involvement:No   Subjective: Patient presented to session to address concerns of anxiety and depression.  He reported mixed progress at this time.  He presented to session with his wife.  Patient and wife processed experience of challenges in marriage including ways in which patient challenges of the past regarding financial compulsions have impacted patient self-esteem and lead patient's spouse to assume the role in relationship she did not anticipate.  Counselor actively listened, affirmed feelings and experience, and help to facilitate insight and develop strategies into these concerns.  Couple processed experience of their challenges, and of positive conversations recently in which they felt that they treated 1 another with greater intentionality.  Counselor reinforced practice of leaning into 1 and others bids.  They discussed ways in  which to make finances equitable and trustworthy in the present and going forward.  Counselor helped facilitate insight into underlying motivation for patient past tendencies/mechanism, and discussed patient needs including validation.  They discussed mindfulness around nurturing healing narrative to build intimacy and connection toward increased vitality in relationship.  They discussed patient limiting self-deprecation that may shut down further conversations at times, and reflecting together on ways in which they can love each other forgivingly and redemptively so as to shift stuck narrative/behavior patterns in positive direction.   Interventions: Solution-Oriented/Positive Psychology, Humanistic/Existential, Psycho-education/Bibliotherapy, Insight-Oriented, and Interpersonal  Diagnosis:   ICD-10-CM   1. Generalized anxiety disorder  F41.1     2. Major depressive disorder, recurrent episode, mild  F33.0       Plan: Patient is scheduled for follow-up; continue process work and developing coping skills.  Short-term goal between sessions to continue with intentionality, working towards shared goals including transparency, building of trust, nurturing of connection and intimacy; continue to reflect on ways in which forgiveness can transform as individuals and in relationship.  [name], [facility]

## 2024-03-22 ENCOUNTER — Other Ambulatory Visit (HOSPITAL_BASED_OUTPATIENT_CLINIC_OR_DEPARTMENT_OTHER): Payer: Self-pay

## 2024-03-22 ENCOUNTER — Other Ambulatory Visit: Payer: Self-pay

## 2024-03-23 ENCOUNTER — Other Ambulatory Visit (HOSPITAL_BASED_OUTPATIENT_CLINIC_OR_DEPARTMENT_OTHER): Payer: Self-pay

## 2024-03-24 ENCOUNTER — Other Ambulatory Visit (HOSPITAL_BASED_OUTPATIENT_CLINIC_OR_DEPARTMENT_OTHER): Payer: Self-pay

## 2024-03-24 ENCOUNTER — Encounter (HOSPITAL_BASED_OUTPATIENT_CLINIC_OR_DEPARTMENT_OTHER): Payer: Self-pay

## 2024-03-25 ENCOUNTER — Other Ambulatory Visit (HOSPITAL_BASED_OUTPATIENT_CLINIC_OR_DEPARTMENT_OTHER): Payer: Self-pay

## 2024-03-25 MED ORDER — WEGOVY 2.4 MG/0.75ML ~~LOC~~ SOAJ
2.4000 mg | SUBCUTANEOUS | 2 refills | Status: DC
  Filled 2024-03-25 – 2024-04-19 (×2): qty 3, 28d supply, fill #0

## 2024-03-27 ENCOUNTER — Other Ambulatory Visit (HOSPITAL_BASED_OUTPATIENT_CLINIC_OR_DEPARTMENT_OTHER): Payer: Self-pay

## 2024-04-19 ENCOUNTER — Other Ambulatory Visit: Payer: Self-pay

## 2024-04-19 ENCOUNTER — Other Ambulatory Visit (HOSPITAL_BASED_OUTPATIENT_CLINIC_OR_DEPARTMENT_OTHER): Payer: Self-pay

## 2024-04-20 ENCOUNTER — Encounter: Payer: Self-pay | Admitting: Professional Counselor

## 2024-04-20 ENCOUNTER — Ambulatory Visit: Admitting: Professional Counselor

## 2024-04-20 DIAGNOSIS — F330 Major depressive disorder, recurrent, mild: Secondary | ICD-10-CM

## 2024-04-20 DIAGNOSIS — F411 Generalized anxiety disorder: Secondary | ICD-10-CM

## 2024-04-20 NOTE — Progress Notes (Signed)
°      [facility] Counselor/Therapist Progress Note  Patient ID: Ronald Long, MRN: [id number],    Date: 04/20/2024  Time Spent: 3:16 PM to 4:08 PM  Treatment Type: Family with patient  Patient presented to session with spouse.  Reported Symptoms: Anxiousness, worries, restlessness, low mood, fatigue, stress, self-esteem concerns, phase of life concerns, interpersonal concerns  Mental Status Exam:  Appearance:   Casual     Behavior:  Appropriate and Sharing  Motor:  Normal  Speech/Language:   Clear and Coherent and Normal Rate  Affect:  Appropriate and Congruent  Mood:  normal  Thought process:  normal  Thought content:    WNL  Sensory/Perceptual disturbances:    WNL  Orientation:  oriented to person, place, time/date, and situation  Attention:  Good  Concentration:  Good  Memory:  WNL  Fund of knowledge:   Good  Insight:    Good  Judgment:   Good  Impulse Control:  Good   Risk Assessment: Danger to Self:  No Self-injurious Behavior: No Danger to Others: No Duty to Warn:no Physical Aggression / Violence:No  Access to Firearms a concern: No  Gang Involvement:No   Subjective: Patient presented to session to address concerns of anxiety and depression.  He presented to session with his spouse.  Patient identified progress including as relates to marital relationship wherein he and spouse spend quality time together, and he has been working to practice increased sense of presence and attentiveness.  Counselor affirmed progress, and helped to facilitate insight into patient self deprecating tendencies as relates to narrative that centers around wrongdoing.  Counselor patient and patient spouse discussed mindfulness around globalizing patterns, and ways in which tendencies of self-deprecation may compromise otherwise productive communications and/or connections.  They discussed strategies for substituting validation needs healthily.  They discussed idea of servant leadership in  marriage, and patient's strong values around wanting to please, and to experience vitality in primary relationship.  They discussed how to translate the exciting nature of impulsive tendencies by history into sense of vitality and desire for increased connection and intimacy.  Patient identified short-term goal of being mindful of confidence in himself and his efforts, and spouse mindful of enhancing appreciations, and both in resourcing the value of the moment and practicing lifting up and supporting one another and rebuilding trust and connection.  Interventions: Cognitive Behavioral Therapy, Assertiveness/Communication, Solution-Oriented/Positive Psychology, Humanistic/Existential, Insight-Oriented, and Interpersonal  Diagnosis:   ICD-10-CM   1. Generalized anxiety disorder  F41.1     2. Major depressive disorder, recurrent episode, mild  F33.0       Plan: Patient is scheduled for follow-up; continue process work and developing coping skills.  Patient short-term goal between sessions to practice mindfulness around thought and behavior patterns of concern, continue to practice interpersonal effectiveness skills, and prioritize quality time and being present with spouse.  Practice positive self-talk and practice reducing self limiting verbalizations.  [name], [facility]

## 2024-05-18 ENCOUNTER — Other Ambulatory Visit: Payer: Self-pay

## 2024-05-18 ENCOUNTER — Other Ambulatory Visit (HOSPITAL_BASED_OUTPATIENT_CLINIC_OR_DEPARTMENT_OTHER): Payer: Self-pay

## 2024-05-18 MED ORDER — WEGOVY 1.7 MG/0.75ML ~~LOC~~ SOAJ
1.7000 mg | SUBCUTANEOUS | 0 refills | Status: AC
  Filled 2024-05-18 (×2): qty 3, 28d supply, fill #0

## 2024-05-19 ENCOUNTER — Other Ambulatory Visit (HOSPITAL_BASED_OUTPATIENT_CLINIC_OR_DEPARTMENT_OTHER): Payer: Self-pay

## 2024-05-19 ENCOUNTER — Other Ambulatory Visit: Payer: Self-pay

## 2024-06-15 ENCOUNTER — Other Ambulatory Visit (HOSPITAL_BASED_OUTPATIENT_CLINIC_OR_DEPARTMENT_OTHER): Payer: Self-pay

## 2024-06-15 MED ORDER — WEGOVY 1 MG/0.5ML ~~LOC~~ SOAJ
0.5000 mL | SUBCUTANEOUS | 0 refills | Status: AC
  Filled 2024-06-15: qty 2, 28d supply, fill #0

## 2024-06-16 ENCOUNTER — Ambulatory Visit: Admitting: Professional Counselor

## 2024-06-16 ENCOUNTER — Encounter: Payer: Self-pay | Admitting: Professional Counselor

## 2024-06-16 DIAGNOSIS — F330 Major depressive disorder, recurrent, mild: Secondary | ICD-10-CM

## 2024-06-16 DIAGNOSIS — F411 Generalized anxiety disorder: Secondary | ICD-10-CM

## 2024-06-16 NOTE — Progress Notes (Signed)
"   °      [facility] Counselor/Therapist Progress Note  Patient ID: Ronald Long, MRN: [id number],    Date: 06/16/2024  Time Spent: 2:18 PM to 3:17 PM  Treatment Type: Family with patient  Patient presented to session with his wife.  Reported Symptoms: anxiousness, worries, stress, interpersonal concerns, phase of life concerns, low mood, self-esteem concerns, restlessness  Mental Status Exam:  Appearance:   Casual     Behavior:  Appropriate and Sharing  Motor:  Normal  Speech/Language:   Clear and Coherent and Normal Rate  Affect:  Appropriate and Congruent  Mood:  normal  Thought process:  normal  Thought content:    WNL  Sensory/Perceptual disturbances:    WNL  Orientation:  oriented to person, place, time/date, and situation  Attention:  Good  Concentration:  Good  Memory:  WNL  Fund of knowledge:   Good  Insight:    Good  Judgment:   Good  Impulse Control:  Good   Risk Assessment: Danger to Self:  No Self-injurious Behavior: No Danger to Others: No Duty to Warn:no Physical Aggression / Violence:No  Access to Firearms a concern: No  Gang Involvement:No   Subjective: Patient presented to session to address concerns of anxiety and depression.  He presented to session with his spouse.  He reported mixed progress at this time.  Patient identified considerable work stress at this time, with changes unfolding and concern for impact to him.  He identified continued marital stress.  He processed experience of positive holidays and time with family, daughters home from college.  Patient and spouse discussed marital concerns at length.  Counselor actively listened, affirmed feelings and experiences, help to facilitate insight, and develop strategies for improved communication and connection.  Patient and spouse processed their experience of orientation around matters of intimacy, and related frustrations, and agreed that increased attention to unmet needs and expectations for both  could result in more joy and satisfaction in marital relationship.  Interventions: Solution-Oriented/Positive Psychology, Humanistic/Existential, Insight-Oriented, and Interpersonal  Diagnosis:   ICD-10-CM   1. Generalized anxiety disorder  F41.1     2. Major depressive disorder, recurrent episode, mild  F33.0       Plan: Patient is scheduled for follow-up; continue process work and developing coping skills.  Patient short-term goal between sessions to mitigate work stress with healthy coping skills, and continue to reflect with spouse on matters of emotional needs including as relates intimacy and connection opportunities.  Ronald Long, [facility]                   "

## 2024-07-20 ENCOUNTER — Ambulatory Visit: Admitting: Professional Counselor

## 2024-08-18 ENCOUNTER — Ambulatory Visit: Admitting: Professional Counselor

## 2024-09-15 ENCOUNTER — Ambulatory Visit: Admitting: Professional Counselor

## 2024-10-12 ENCOUNTER — Ambulatory Visit (INDEPENDENT_AMBULATORY_CARE_PROVIDER_SITE_OTHER): Admitting: Professional Counselor
